# Patient Record
Sex: Female | Born: 1935 | Race: Black or African American | Hispanic: No | Marital: Married | Smoking: Never smoker
Health system: Southern US, Community
[De-identification: ages and names within clinical notes are randomized; demographics above are authoritative.]

## PROBLEM LIST (undated history)

## (undated) DIAGNOSIS — F0390 Unspecified dementia without behavioral disturbance: Secondary | ICD-10-CM

## (undated) DIAGNOSIS — E785 Hyperlipidemia, unspecified: Secondary | ICD-10-CM

## (undated) DIAGNOSIS — F0280 Dementia in other diseases classified elsewhere without behavioral disturbance: Secondary | ICD-10-CM

## (undated) DIAGNOSIS — G4700 Insomnia, unspecified: Secondary | ICD-10-CM

## (undated) DIAGNOSIS — I10 Essential (primary) hypertension: Secondary | ICD-10-CM

## (undated) DIAGNOSIS — G309 Alzheimer's disease, unspecified: Secondary | ICD-10-CM

## (undated) DIAGNOSIS — M1990 Unspecified osteoarthritis, unspecified site: Secondary | ICD-10-CM

## (undated) HISTORY — DX: Unspecified osteoarthritis, unspecified site: M19.90

## (undated) HISTORY — DX: Essential (primary) hypertension: I10

## (undated) HISTORY — DX: Hyperlipidemia, unspecified: E78.5

## (undated) HISTORY — PX: ABDOMINAL HYSTERECTOMY: SHX81

---

## 2001-03-01 ENCOUNTER — Other Ambulatory Visit: Admission: RE | Admit: 2001-03-01 | Discharge: 2001-03-01 | Payer: Self-pay | Admitting: Family Medicine

## 2001-05-08 ENCOUNTER — Ambulatory Visit (HOSPITAL_COMMUNITY): Admission: RE | Admit: 2001-05-08 | Discharge: 2001-05-08 | Payer: Self-pay | Admitting: Family Medicine

## 2001-05-08 ENCOUNTER — Encounter: Payer: Self-pay | Admitting: Family Medicine

## 2001-11-12 ENCOUNTER — Encounter: Admission: RE | Admit: 2001-11-12 | Discharge: 2001-11-12 | Payer: Self-pay | Admitting: Family Medicine

## 2001-11-12 ENCOUNTER — Encounter: Payer: Self-pay | Admitting: Family Medicine

## 2002-05-14 ENCOUNTER — Ambulatory Visit (HOSPITAL_COMMUNITY): Admission: RE | Admit: 2002-05-14 | Discharge: 2002-05-14 | Payer: Self-pay | Admitting: Family Medicine

## 2002-05-14 ENCOUNTER — Encounter: Payer: Self-pay | Admitting: Family Medicine

## 2002-11-11 ENCOUNTER — Ambulatory Visit (HOSPITAL_COMMUNITY): Admission: RE | Admit: 2002-11-11 | Discharge: 2002-11-11 | Payer: Self-pay | Admitting: Family Medicine

## 2002-11-11 ENCOUNTER — Encounter: Payer: Self-pay | Admitting: Family Medicine

## 2003-05-19 ENCOUNTER — Ambulatory Visit (HOSPITAL_COMMUNITY): Admission: RE | Admit: 2003-05-19 | Discharge: 2003-05-19 | Payer: Self-pay | Admitting: Family Medicine

## 2003-06-08 ENCOUNTER — Ambulatory Visit (HOSPITAL_COMMUNITY): Admission: RE | Admit: 2003-06-08 | Discharge: 2003-06-08 | Payer: Self-pay | Admitting: Family Medicine

## 2003-06-25 ENCOUNTER — Ambulatory Visit (HOSPITAL_COMMUNITY): Admission: RE | Admit: 2003-06-25 | Discharge: 2003-06-25 | Payer: Self-pay | Admitting: Family Medicine

## 2004-03-30 ENCOUNTER — Ambulatory Visit: Payer: Self-pay | Admitting: Family Medicine

## 2004-05-26 ENCOUNTER — Ambulatory Visit: Payer: Self-pay | Admitting: Family Medicine

## 2004-05-26 ENCOUNTER — Encounter (INDEPENDENT_AMBULATORY_CARE_PROVIDER_SITE_OTHER): Payer: Self-pay | Admitting: *Deleted

## 2004-05-26 LAB — CONVERTED CEMR LAB: Pap Smear: NORMAL

## 2004-05-29 ENCOUNTER — Emergency Department (HOSPITAL_COMMUNITY): Admission: EM | Admit: 2004-05-29 | Discharge: 2004-05-29 | Payer: Self-pay | Admitting: Emergency Medicine

## 2004-06-20 ENCOUNTER — Ambulatory Visit (HOSPITAL_COMMUNITY): Admission: RE | Admit: 2004-06-20 | Discharge: 2004-06-20 | Payer: Self-pay | Admitting: Family Medicine

## 2004-07-07 ENCOUNTER — Ambulatory Visit: Payer: Self-pay | Admitting: Family Medicine

## 2004-08-02 ENCOUNTER — Ambulatory Visit (HOSPITAL_COMMUNITY): Admission: RE | Admit: 2004-08-02 | Discharge: 2004-08-02 | Payer: Self-pay | Admitting: Family Medicine

## 2004-08-26 ENCOUNTER — Ambulatory Visit: Payer: Self-pay | Admitting: Family Medicine

## 2004-12-27 ENCOUNTER — Ambulatory Visit: Payer: Self-pay | Admitting: Family Medicine

## 2005-03-29 ENCOUNTER — Ambulatory Visit: Payer: Self-pay | Admitting: Family Medicine

## 2005-04-05 ENCOUNTER — Ambulatory Visit: Payer: Self-pay | Admitting: Family Medicine

## 2005-06-23 ENCOUNTER — Ambulatory Visit (HOSPITAL_COMMUNITY): Admission: RE | Admit: 2005-06-23 | Discharge: 2005-06-23 | Payer: Self-pay | Admitting: Family Medicine

## 2005-06-28 ENCOUNTER — Ambulatory Visit: Payer: Self-pay | Admitting: Family Medicine

## 2005-06-28 ENCOUNTER — Encounter (INDEPENDENT_AMBULATORY_CARE_PROVIDER_SITE_OTHER): Payer: Self-pay | Admitting: *Deleted

## 2005-10-27 ENCOUNTER — Ambulatory Visit: Payer: Self-pay | Admitting: Family Medicine

## 2006-02-05 ENCOUNTER — Ambulatory Visit: Payer: Self-pay | Admitting: Family Medicine

## 2006-02-26 ENCOUNTER — Ambulatory Visit: Payer: Self-pay | Admitting: Family Medicine

## 2006-02-27 ENCOUNTER — Ambulatory Visit (HOSPITAL_COMMUNITY): Admission: RE | Admit: 2006-02-27 | Discharge: 2006-02-27 | Payer: Self-pay | Admitting: Family Medicine

## 2006-06-25 ENCOUNTER — Encounter: Payer: Self-pay | Admitting: Family Medicine

## 2006-06-25 ENCOUNTER — Ambulatory Visit (HOSPITAL_COMMUNITY): Admission: RE | Admit: 2006-06-25 | Discharge: 2006-06-25 | Payer: Self-pay | Admitting: Family Medicine

## 2006-06-25 LAB — CONVERTED CEMR LAB
ALT: 14 units/L (ref 0–35)
Alkaline Phosphatase: 55 units/L (ref 39–117)
Bilirubin, Direct: 0.1 mg/dL (ref 0.0–0.3)
Cholesterol: 246 mg/dL — ABNORMAL HIGH (ref 0–200)
Indirect Bilirubin: 0.7 mg/dL (ref 0.0–0.9)
LDL Cholesterol: 162 mg/dL — ABNORMAL HIGH (ref 0–99)
Total Protein: 7.3 g/dL (ref 6.0–8.3)
Triglycerides: 166 mg/dL — ABNORMAL HIGH (ref <150)

## 2006-06-28 ENCOUNTER — Ambulatory Visit: Payer: Self-pay | Admitting: Family Medicine

## 2006-09-21 ENCOUNTER — Encounter: Payer: Self-pay | Admitting: Family Medicine

## 2006-09-21 LAB — CONVERTED CEMR LAB
Alkaline Phosphatase: 53 units/L (ref 39–117)
BUN: 17 mg/dL (ref 6–23)
Bilirubin, Direct: 0.1 mg/dL (ref 0.0–0.3)
Chloride: 102 meq/L (ref 96–112)
Creatinine, Ser: 1.12 mg/dL (ref 0.40–1.20)
Glucose, Bld: 91 mg/dL (ref 70–99)
Indirect Bilirubin: 0.7 mg/dL (ref 0.0–0.9)
LDL Cholesterol: 166 mg/dL — ABNORMAL HIGH (ref 0–99)
Total Protein: 7.3 g/dL (ref 6.0–8.3)
Triglycerides: 162 mg/dL — ABNORMAL HIGH (ref <150)

## 2006-09-26 ENCOUNTER — Encounter: Payer: Self-pay | Admitting: Family Medicine

## 2006-09-26 ENCOUNTER — Ambulatory Visit: Payer: Self-pay | Admitting: Family Medicine

## 2006-09-26 ENCOUNTER — Other Ambulatory Visit: Admission: RE | Admit: 2006-09-26 | Discharge: 2006-09-26 | Payer: Self-pay | Admitting: Family Medicine

## 2006-10-01 ENCOUNTER — Encounter (INDEPENDENT_AMBULATORY_CARE_PROVIDER_SITE_OTHER): Payer: Self-pay | Admitting: *Deleted

## 2006-10-01 LAB — CONVERTED CEMR LAB: Pap Smear: NORMAL

## 2007-01-28 ENCOUNTER — Encounter: Payer: Self-pay | Admitting: Family Medicine

## 2007-01-28 LAB — CONVERTED CEMR LAB
BUN: 13 mg/dL (ref 6–23)
CO2: 27 meq/L (ref 19–32)
Chloride: 103 meq/L (ref 96–112)
Creatinine, Ser: 1.11 mg/dL (ref 0.40–1.20)
Glucose, Bld: 90 mg/dL (ref 70–99)
Potassium: 3.6 meq/L (ref 3.5–5.3)

## 2007-01-29 ENCOUNTER — Encounter: Payer: Self-pay | Admitting: Family Medicine

## 2007-01-29 LAB — CONVERTED CEMR LAB
Alkaline Phosphatase: 48 units/L (ref 39–117)
Bilirubin, Direct: 0.1 mg/dL (ref 0.0–0.3)
Indirect Bilirubin: 0.4 mg/dL (ref 0.0–0.9)
LDL Cholesterol: 131 mg/dL — ABNORMAL HIGH (ref 0–99)
Total Bilirubin: 0.5 mg/dL (ref 0.3–1.2)
Triglycerides: 172 mg/dL — ABNORMAL HIGH (ref <150)
VLDL: 34 mg/dL (ref 0–40)

## 2007-02-01 ENCOUNTER — Ambulatory Visit: Payer: Self-pay | Admitting: Family Medicine

## 2007-05-02 ENCOUNTER — Encounter: Payer: Self-pay | Admitting: Family Medicine

## 2007-05-30 ENCOUNTER — Encounter: Payer: Self-pay | Admitting: Family Medicine

## 2007-05-30 LAB — CONVERTED CEMR LAB
AST: 16 units/L (ref 0–37)
Albumin: 4.7 g/dL (ref 3.5–5.2)
Alkaline Phosphatase: 48 units/L (ref 39–117)
Basophils Absolute: 0 10*3/uL (ref 0.0–0.1)
Basophils Relative: 0 % (ref 0–1)
Bilirubin, Direct: 0.1 mg/dL (ref 0.0–0.3)
Eosinophils Relative: 2 % (ref 0–5)
HCT: 41.2 % (ref 36.0–46.0)
HDL: 51 mg/dL (ref >39)
Hemoglobin: 13.6 g/dL (ref 12.0–15.0)
LDL Cholesterol: 127 mg/dL — ABNORMAL HIGH (ref 0–99)
MCHC: 33 g/dL (ref 30.0–36.0)
MCV: 80.2 fL (ref 78.0–100.0)
Monocytes Absolute: 0.4 10*3/uL (ref 0.1–1.0)
Monocytes Relative: 9 % (ref 3–12)
Neutro Abs: 1.4 10*3/uL — ABNORMAL LOW (ref 1.7–7.7)
RBC: 5.14 M/uL — ABNORMAL HIGH (ref 3.87–5.11)
RDW: 15.2 % (ref 11.5–15.5)
Total Bilirubin: 0.6 mg/dL (ref 0.3–1.2)
Total CHOL/HDL Ratio: 4.2

## 2007-06-04 ENCOUNTER — Ambulatory Visit: Payer: Self-pay | Admitting: Family Medicine

## 2007-06-10 ENCOUNTER — Encounter (HOSPITAL_COMMUNITY): Admission: RE | Admit: 2007-06-10 | Discharge: 2007-07-10 | Payer: Self-pay | Admitting: Family Medicine

## 2007-06-27 ENCOUNTER — Ambulatory Visit (HOSPITAL_COMMUNITY): Admission: RE | Admit: 2007-06-27 | Discharge: 2007-06-27 | Payer: Self-pay | Admitting: Family Medicine

## 2007-07-22 ENCOUNTER — Ambulatory Visit: Payer: Self-pay | Admitting: Family Medicine

## 2007-07-25 ENCOUNTER — Encounter (INDEPENDENT_AMBULATORY_CARE_PROVIDER_SITE_OTHER): Payer: Self-pay | Admitting: *Deleted

## 2007-07-25 DIAGNOSIS — M1710 Unilateral primary osteoarthritis, unspecified knee: Secondary | ICD-10-CM

## 2007-07-25 DIAGNOSIS — M7520 Bicipital tendinitis, unspecified shoulder: Secondary | ICD-10-CM | POA: Insufficient documentation

## 2007-07-25 DIAGNOSIS — E785 Hyperlipidemia, unspecified: Secondary | ICD-10-CM

## 2007-07-30 ENCOUNTER — Encounter (HOSPITAL_COMMUNITY): Admission: RE | Admit: 2007-07-30 | Discharge: 2007-08-29 | Payer: Self-pay | Admitting: Family Medicine

## 2007-08-30 ENCOUNTER — Encounter: Payer: Self-pay | Admitting: Family Medicine

## 2007-08-30 LAB — CONVERTED CEMR LAB
ALT: 11 units/L (ref 0–35)
AST: 15 units/L (ref 0–37)
Albumin: 4.5 g/dL (ref 3.5–5.2)
Calcium: 9.6 mg/dL (ref 8.4–10.5)
Cholesterol: 222 mg/dL — ABNORMAL HIGH (ref 0–200)
HDL: 46 mg/dL (ref >39)
Potassium: 4.5 meq/L (ref 3.5–5.3)
Sodium: 142 meq/L (ref 135–145)
Total CHOL/HDL Ratio: 4.8
Total Protein: 7.1 g/dL (ref 6.0–8.3)
VLDL: 23 mg/dL (ref 0–40)

## 2007-09-02 ENCOUNTER — Ambulatory Visit: Payer: Self-pay | Admitting: Family Medicine

## 2007-09-02 ENCOUNTER — Encounter (HOSPITAL_COMMUNITY): Admission: RE | Admit: 2007-09-02 | Discharge: 2007-10-02 | Payer: Self-pay | Admitting: Family Medicine

## 2007-11-29 ENCOUNTER — Encounter: Payer: Self-pay | Admitting: Family Medicine

## 2007-11-29 LAB — CONVERTED CEMR LAB
ALT: 12 units/L (ref 0–35)
AST: 16 units/L (ref 0–37)
Alkaline Phosphatase: 56 units/L (ref 39–117)
Bilirubin, Direct: 0.1 mg/dL (ref 0.0–0.3)
Cholesterol: 195 mg/dL (ref 0–200)
Indirect Bilirubin: 0.7 mg/dL (ref 0.0–0.9)
Triglycerides: 157 mg/dL — ABNORMAL HIGH (ref <150)

## 2007-12-04 ENCOUNTER — Encounter: Payer: Self-pay | Admitting: Family Medicine

## 2007-12-04 ENCOUNTER — Ambulatory Visit: Payer: Self-pay | Admitting: Family Medicine

## 2008-04-01 ENCOUNTER — Encounter: Payer: Self-pay | Admitting: Family Medicine

## 2008-04-02 LAB — CONVERTED CEMR LAB
ALT: 15 units/L (ref 0–35)
Alkaline Phosphatase: 49 units/L (ref 39–117)
BUN: 16 mg/dL (ref 6–23)
Bilirubin, Direct: 0.1 mg/dL (ref 0.0–0.3)
Calcium: 9.7 mg/dL (ref 8.4–10.5)
Cholesterol: 216 mg/dL — ABNORMAL HIGH (ref 0–200)
Creatinine, Ser: 0.99 mg/dL (ref 0.40–1.20)
Glucose, Bld: 98 mg/dL (ref 70–99)
Indirect Bilirubin: 0.6 mg/dL (ref 0.0–0.9)
Potassium: 4.4 meq/L (ref 3.5–5.3)
VLDL: 31 mg/dL (ref 0–40)

## 2008-04-06 ENCOUNTER — Ambulatory Visit: Payer: Self-pay | Admitting: Family Medicine

## 2008-07-30 ENCOUNTER — Encounter: Payer: Self-pay | Admitting: Family Medicine

## 2008-08-03 LAB — CONVERTED CEMR LAB
Albumin: 4.4 g/dL (ref 3.5–5.2)
Indirect Bilirubin: 0.6 mg/dL (ref 0.0–0.9)
LDL Cholesterol: 91 mg/dL (ref 0–99)
Total Bilirubin: 0.7 mg/dL (ref 0.3–1.2)
Total Protein: 6.8 g/dL (ref 6.0–8.3)
Triglycerides: 145 mg/dL (ref <150)
VLDL: 29 mg/dL (ref 0–40)

## 2008-08-05 ENCOUNTER — Ambulatory Visit: Payer: Self-pay | Admitting: Family Medicine

## 2008-08-05 DIAGNOSIS — R5381 Other malaise: Secondary | ICD-10-CM

## 2008-08-05 DIAGNOSIS — E663 Overweight: Secondary | ICD-10-CM | POA: Insufficient documentation

## 2008-08-05 DIAGNOSIS — R5383 Other fatigue: Secondary | ICD-10-CM

## 2008-08-06 DIAGNOSIS — I10 Essential (primary) hypertension: Secondary | ICD-10-CM | POA: Insufficient documentation

## 2008-08-11 ENCOUNTER — Ambulatory Visit (HOSPITAL_COMMUNITY): Admission: RE | Admit: 2008-08-11 | Discharge: 2008-08-11 | Payer: Self-pay | Admitting: Family Medicine

## 2008-09-09 ENCOUNTER — Encounter: Payer: Self-pay | Admitting: Family Medicine

## 2008-12-28 LAB — CONVERTED CEMR LAB
ALT: 13 units/L (ref 0–35)
Bilirubin, Direct: 0.2 mg/dL (ref 0.0–0.3)
CO2: 26 meq/L (ref 19–32)
Chloride: 105 meq/L (ref 96–112)
Glucose, Bld: 104 mg/dL — ABNORMAL HIGH (ref 70–99)
Hemoglobin: 12.5 g/dL (ref 12.0–15.0)
Lymphocytes Relative: 51 % — ABNORMAL HIGH (ref 12–46)
Monocytes Absolute: 0.6 10*3/uL (ref 0.1–1.0)
Monocytes Relative: 15 % — ABNORMAL HIGH (ref 3–12)
Neutro Abs: 1.3 10*3/uL — ABNORMAL LOW (ref 1.7–7.7)
Potassium: 3.9 meq/L (ref 3.5–5.3)
RBC: 4.83 M/uL (ref 3.87–5.11)
Sodium: 144 meq/L (ref 135–145)
TSH: 3.049 microintl units/mL (ref 0.350–4.500)
Total Bilirubin: 0.7 mg/dL (ref 0.3–1.2)
Total CHOL/HDL Ratio: 3.2
VLDL: 17 mg/dL (ref 0–40)

## 2009-01-01 ENCOUNTER — Other Ambulatory Visit: Admission: RE | Admit: 2009-01-01 | Discharge: 2009-01-01 | Payer: Self-pay | Admitting: Family Medicine

## 2009-01-01 ENCOUNTER — Encounter: Payer: Self-pay | Admitting: Family Medicine

## 2009-01-01 ENCOUNTER — Ambulatory Visit: Payer: Self-pay | Admitting: Family Medicine

## 2009-01-01 LAB — CONVERTED CEMR LAB: OCCULT 1: NEGATIVE

## 2009-01-11 ENCOUNTER — Encounter: Payer: Self-pay | Admitting: Family Medicine

## 2009-01-14 ENCOUNTER — Telehealth: Payer: Self-pay | Admitting: Family Medicine

## 2009-02-10 ENCOUNTER — Ambulatory Visit (HOSPITAL_COMMUNITY): Admission: RE | Admit: 2009-02-10 | Discharge: 2009-02-10 | Payer: Self-pay | Admitting: Family Medicine

## 2009-02-22 ENCOUNTER — Encounter: Payer: Self-pay | Admitting: Family Medicine

## 2009-05-14 LAB — CONVERTED CEMR LAB
ALT: 11 units/L (ref 0–35)
Albumin: 4.4 g/dL (ref 3.5–5.2)
Alkaline Phosphatase: 58 units/L (ref 39–117)
Chloride: 104 meq/L (ref 96–112)
Creatinine, Ser: 0.93 mg/dL (ref 0.40–1.20)
HDL: 56 mg/dL (ref >39)
LDL Cholesterol: 109 mg/dL — ABNORMAL HIGH (ref 0–99)
Potassium: 4.3 meq/L (ref 3.5–5.3)
Total Protein: 6.9 g/dL (ref 6.0–8.3)
Triglycerides: 78 mg/dL (ref <150)

## 2009-05-19 ENCOUNTER — Ambulatory Visit: Payer: Self-pay | Admitting: Family Medicine

## 2009-05-21 ENCOUNTER — Telehealth: Payer: Self-pay | Admitting: Family Medicine

## 2009-08-27 ENCOUNTER — Ambulatory Visit (HOSPITAL_COMMUNITY): Admission: RE | Admit: 2009-08-27 | Discharge: 2009-08-27 | Payer: Self-pay | Admitting: Family Medicine

## 2009-09-15 ENCOUNTER — Encounter: Payer: Self-pay | Admitting: Family Medicine

## 2009-09-16 ENCOUNTER — Encounter: Payer: Self-pay | Admitting: Family Medicine

## 2009-09-16 LAB — CONVERTED CEMR LAB
ALT: 14 units/L (ref 0–35)
AST: 18 units/L (ref 0–37)
Albumin: 4.6 g/dL (ref 3.5–5.2)
Alkaline Phosphatase: 57 units/L (ref 39–117)
Calcium: 9.9 mg/dL (ref 8.4–10.5)
Chloride: 103 meq/L (ref 96–112)
Cholesterol: 179 mg/dL (ref 0–200)
Creatinine, Ser: 0.95 mg/dL (ref 0.40–1.20)
HDL: 51 mg/dL (ref >39)
LDL Cholesterol: 101 mg/dL — ABNORMAL HIGH (ref 0–99)
Total Protein: 7.3 g/dL (ref 6.0–8.3)
Triglycerides: 136 mg/dL (ref <150)

## 2009-09-22 ENCOUNTER — Ambulatory Visit: Payer: Self-pay | Admitting: Family Medicine

## 2009-09-22 DIAGNOSIS — R7301 Impaired fasting glucose: Secondary | ICD-10-CM | POA: Insufficient documentation

## 2009-10-14 ENCOUNTER — Encounter: Payer: Self-pay | Admitting: Internal Medicine

## 2009-11-10 ENCOUNTER — Ambulatory Visit: Payer: Self-pay | Admitting: Internal Medicine

## 2009-11-10 ENCOUNTER — Ambulatory Visit (HOSPITAL_COMMUNITY): Admission: RE | Admit: 2009-11-10 | Discharge: 2009-11-10 | Payer: Self-pay | Admitting: Internal Medicine

## 2009-11-14 ENCOUNTER — Encounter: Payer: Self-pay | Admitting: Internal Medicine

## 2010-02-04 LAB — CONVERTED CEMR LAB
ALT: 12 units/L (ref 0–35)
Albumin: 4.5 g/dL (ref 3.5–5.2)
Alkaline Phosphatase: 58 units/L (ref 39–117)
Basophils Absolute: 0 10*3/uL (ref 0.0–0.1)
Basophils Relative: 0 % (ref 0–1)
Chloride: 102 meq/L (ref 96–112)
Cholesterol: 169 mg/dL (ref 0–200)
Creatinine, Ser: 1 mg/dL (ref 0.40–1.20)
Eosinophils Absolute: 0.1 10*3/uL (ref 0.0–0.7)
HDL: 49 mg/dL (ref >39)
Indirect Bilirubin: 0.7 mg/dL (ref 0.0–0.9)
LDL Cholesterol: 94 mg/dL (ref 0–99)
MCHC: 31.7 g/dL (ref 30.0–36.0)
MCV: 82.4 fL (ref 78.0–100.0)
Neutro Abs: 1.6 10*3/uL — ABNORMAL LOW (ref 1.7–7.7)
Neutrophils Relative %: 33 % — ABNORMAL LOW (ref 43–77)
Potassium: 4.1 meq/L (ref 3.5–5.3)
RDW: 15.6 % — ABNORMAL HIGH (ref 11.5–15.5)
Total Protein: 6.7 g/dL (ref 6.0–8.3)
Triglycerides: 130 mg/dL (ref <150)
VLDL: 26 mg/dL (ref 0–40)

## 2010-02-09 ENCOUNTER — Ambulatory Visit: Payer: Self-pay | Admitting: Family Medicine

## 2010-05-22 ENCOUNTER — Encounter: Payer: Self-pay | Admitting: Family Medicine

## 2010-05-31 NOTE — Letter (Signed)
Summary: labs  labs   Imported By: Curtis Sites 09/24/2009 15:53:08  _____________________________________________________________________  External Attachment:    Type:   Image     Comment:   External Document

## 2010-05-31 NOTE — Letter (Signed)
Summary: consults  consults   Imported By: Curtis Sites 09/24/2009 15:53:26  _____________________________________________________________________  External Attachment:    Type:   Image     Comment:   External Document

## 2010-05-31 NOTE — Letter (Signed)
Summary: misc.  misc.   Imported By: Curtis Sites 09/24/2009 15:53:49  _____________________________________________________________________  External Attachment:    Type:   Image     Comment:   External Document

## 2010-05-31 NOTE — Assessment & Plan Note (Signed)
Summary: office visit   Vital Signs:  Patient profile:   77 year old female Menstrual status:  hysterectomy Height:      66.5 inches Weight:      174.75 pounds BMI:     27.88 O2 Sat:      99 % Pulse rate:   84 / minute Pulse rhythm:   regular Resp:     16 per minute BP sitting:   118 / 70  (left arm) Cuff size:   regular  Vitals Entered By: Everitt Amber LPN (Sep 22, 2009 8:29 AM)  Nutrition Counseling: Patient's BMI is greater than 25 and therefore counseled on weight management options. CC: Follow up chronic problems   CC:  Follow up chronic problems.  History of Present Illness: Reports  that she has been  doing well. Denies recent fever or chills. Denies sinus pressure, nasal congestion , ear pain or sore throat. Denies chest congestion, or cough productive of sputum. Denies chest pain, palpitations, PND, orthopnea or leg swelling. Denies abdominal pain, nausea, vomitting, diarrhea or constipation. Denies change in bowel movements or bloody stool. Denies dysuria , frequency, incontinence or hesitancy. Denies  joint pain, swelling, or reduced mobility. Denies headaches, vertigo, seizures. Denies depression, anxiety or insomnia. Denies  rash, lesions, or itch.     Current Medications (verified): 1)  Hydrochlorothiazide 25 Mg  Tabs (Hydrochlorothiazide) .... One Tab By Mouth Once Daily 2)  Metoprolol Succinate 25 Mg Xr24h-Tab (Metoprolol Succinate) .... Take 1 Tablet By Mouth Once A Day 3)  Potassium Chloride 10 Meq/145ml Soln (Potassium Chloride) .... Take 1 and 1/2 Tsp By Mouth Once Daily 4)  Ecotrin Low Strength 81 Mg  Tbec (Aspirin) .... One Tab By Mouth Once A Day 5)  Diphenhydramine Hcl 50 Mg Caps (Diphenhydramine Hcl) .... One Cap By Mouth Qhs 6)  Clinical Nutrients 45-Plus Women .... One Tab By Mouth Tid 7)  Osteosheath 4 Enhanced Bone Support .... One Tab By Mouth Tid 8)  Omega-3/ Omega-6 Fish Oil .... One Cap By Mouth Qd 9)  Coq10 100 Mg Caps (Coenzyme Q10)  .... One Cap By Mouth Tid 10)  Cinnamon 1000mg  .... One Cap By Mouth Qd 11)  Caduet 10-40 Mg Tabs (Amlodipine-Atorvastatin) .... One Tab By Mouth Qhs  Allergies (verified): 1)  ! Pcn  Review of Systems      See HPI General:  Complains of fatigue; denies chills and fever; mild fatigue , and intermittent sleep problems, benadryl helps. Eyes:  Denies blurring, eye pain, and red eye. GI:  Complains of constipation; needs to use prune juicwe daily for bowel movements. MS:  Complains of joint pain and stiffness; lg toes start popping but no pain. Endo:  Denies excessive thirst and excessive urination. Heme:  Denies abnormal bruising and bleeding. Allergy:  Denies hives or rash and itching eyes.  Physical Exam  General:  Well-developed,well-nourished,in no acute distress; alert,appropriate and cooperative throughout examination HEENT: No facial asymmetry,  EOMI, No sinus tenderness, TM's Clear, oropharynx  pink and moist.   Chest: Clear to auscultation bilaterally.  CVS: S1, S2, No murmurs, No S3.   Abd: Soft, Nontender.  MS: decreased  ROM spine, hips, shoulders and knees.  Ext: No edema.   CNS: CN 2-12 intact, power tone and sensation normal throughout.   Skin: Intact, no visible lesions or rashes.  Psych: Good eye contact, normal affect.  Memory intact, not anxious or depressed appearing.    Impression & Recommendations:  Problem # 1:  IMPAIRED FASTING GLUCOSE (  JJO-841.66) Assessment Comment Only  Orders: T- Hemoglobin A1C ([id number])low carb , low sugar diet discussed and encouraged.  Problem # 2:  HYPERTENSION (ICD-401.9) Assessment: Unchanged  Her updated medication list for this problem includes:    Hydrochlorothiazide 25 Mg Tabs (Hydrochlorothiazide) ..... One tab by mouth once daily    Metoprolol Succinate 25 Mg Xr24h-tab (Metoprolol succinate) .Marland Kitchen... Take 1 tablet by mouth once a day    Caduet 10-40 Mg Tabs (Amlodipine-atorvastatin) ..... One tab by mouth  qhs  Orders: T-Basic Metabolic Panel [id number])  BP today: 118/70 Prior BP: 112/70 (05/19/2009)  Labs Reviewed: K+: 4.4 (09/15/2009) Creat: : 0.95 (09/15/2009)   Chol: 179 (09/15/2009)   HDL: 51 (09/15/2009)   LDL: 101 (09/15/2009)   TG: 136 (09/15/2009)  Problem # 3:  HYPERLIPIDEMIA (ICD-272.4) Assessment: Comment Only  Her updated medication list for this problem includes:    Caduet 10-40 Mg Tabs (Amlodipine-atorvastatin) ..... One tab by mouth qhs  Orders: T-Hepatic Function [id number]) T-Lipid Profile [id number])  Labs Reviewed: SGOT: 18 (09/15/2009)   SGPT: 14 (09/15/2009)   HDL:51 (09/15/2009), 56 (05/14/2009)  LDL:101 (09/15/2009), 109 (05/14/2009)  Chol:179 (09/15/2009), 181 (05/14/2009)  Trig:136 (09/15/2009), 78 (05/14/2009)  Problem # 4:  OSTEOARTHRITIS, KNEE (ICD-715.96) Assessment: Improved  Her updated medication list for this problem includes:    Ecotrin Low Strength 81 Mg Tbec (Aspirin) ..... One tab by mouth once a day  Complete Medication List: 1)  Hydrochlorothiazide 25 Mg Tabs (Hydrochlorothiazide) .... One tab by mouth once daily 2)  Metoprolol Succinate 25 Mg Xr24h-tab (Metoprolol succinate) .... Take 1 tablet by mouth once a day 3)  Potassium Chloride 10 Meq/182ml Soln (Potassium chloride) .... Take 1 and 1/2 tsp by mouth once daily 4)  Ecotrin Low Strength 81 Mg Tbec (Aspirin) .... One tab by mouth once a day 5)  Diphenhydramine Hcl 50 Mg Caps (Diphenhydramine hcl) .... One cap by mouth qhs 6)  Clinical Nutrients 45-plus Women  .... One tab by mouth tid 7)  Osteosheath 4 Enhanced Bone Support  .... One tab by mouth tid 8)  Omega-3/ Omega-6 Fish Oil  .... One cap by mouth qd 9)  Coq10 100 Mg Caps (Coenzyme q10) .... One cap by mouth tid 10)  Cinnamon 1000mg   .... One cap by mouth qd 11)  Caduet 10-40 Mg Tabs (Amlodipine-atorvastatin) .... One tab by mouth qhs  Other Orders: T-CBC w/Diff ([id number]) Gastroenterology Referral (GI) Future  Orders: Radiology Referral (Radiology) ... 09/23/2009  Patient Instructions: 1)  F/U in 4.5 months 2)  You are doing very well. pls continue  eat alot of fruit and veg., and commit to regular physical activity 20 to 30 mins daily. 3)  No med changes. 4)  You need a colonscopy and bone density scan mn Oct we will sched 5)  BMP prior to visit, ICD-9: 6)  Hepatic Panel prior to visit, ICD-9: 7)  Lipid Panel prior to visit, ICD-9:    8)  CBC w/ Diff prior to visit, ICD-9:   fasting in 4.5 months 9)  HbgA1C prior to visit, ICD-9: Prescriptions: POTASSIUM CHLORIDE 10 MEQ/100ML SOLN (POTASSIUM CHLORIDE) Take 1 and 1/2 tsp by mouth once daily  #480 x 3   Entered by:   Everitt Amber LPN   Authorized by:   Syliva Overman MD   Signed by:   Everitt Amber LPN on 60/73/7106   Method used:   Electronically to        [facility] (retail)       [phone]       [place], [place]  [address]       Ph: [phone]       Fax: [phone]   RxID:   [id number] HYDROCHLOROTHIAZIDE 25 MG  TABS (HYDROCHLOROTHIAZIDE) one tab by mouth once daily  #120 Each x 3   Entered by:   Everitt Amber LPN   Authorized by:   Syliva Overman MD   Signed by:   Everitt Amber LPN on 84/13/2440   Method used:   Electronically to        [facility] (retail)       [address] [place], [place]  [address]       Ph: [phone]       Fax: [phone]   RxID:   [id number]

## 2010-05-31 NOTE — Letter (Signed)
Summary: xray  xray   Imported By: Curtis Sites 09/24/2009 15:54:31  _____________________________________________________________________  External Attachment:    Type:   Image     Comment:   External Document

## 2010-05-31 NOTE — Assessment & Plan Note (Signed)
Summary: F UP   Vital Signs:  Patient profile:   77 year old female Menstrual status:  hysterectomy Height:      66.5 inches Weight:      175.75 pounds BMI:     28.04 O2 Sat:      99 % on Room air Pulse rate:   73 / minute Pulse rhythm:   regular Resp:     16 per minute BP sitting:   122 / 60  (left arm)  Vitals Entered By: Mauricia Area, CMA  Nutrition Counseling: Patient's BMI is greater than 25 and therefore counseled on weight management options.  O2 Flow:  Room air CC: Follow up   CC:  Follow up.  History of Present Illness: Reports  that she has been doing well. Denies recent fever or chills. Denies sinus pressure, nasal congestion , ear pain or sore throat. Denies chest congestion, or cough productive of sputum. Denies chest pain, palpitations, PND, orthopnea or leg swelling. Denies abdominal pain, nausea, vomitting, diarrhea or constipation. Denies change in bowel movements or bloody stool. Denies dysuria , frequency, incontinence or hesitancy. she does have occasioonal  joint pain,  and  reduced mobility. Denies headaches, vertigo, seizures. Denies depression, anxiety or insomnia. Denies  rash, lesions, or itch.     Allergies (verified): 1)  ! Pcn  Review of Systems      See HPI Eyes:  Denies blurring, discharge, eye pain, and red eye. Endo:  Denies cold intolerance, excessive hunger, excessive thirst, and heat intolerance. Heme:  Denies abnormal bruising and bleeding. Allergy:  Complains of seasonal allergies; denies hives or rash and itching eyes.  Physical Exam  General:  Well-developed,well-nourished,in no acute distress; alert,appropriate and cooperative throughout examination HEENT: No facial asymmetry,  EOMI, No sinus tenderness, TM's Clear, oropharynx  pink and moist.   Chest: Clear to auscultation bilaterally.  CVS: S1, S2, No murmurs, No S3.   Abd: Soft, Nontender.  MS: decreased  ROM spine, hips, shoulders and knees.  Ext: No edema.     CNS: CN 2-12 intact, power tone and sensation normal throughout.   Skin: Intact, no visible lesions or rashes.  Psych: Good eye contact, normal affect.  Memory intact, not anxious or depressed appearing.    Impression & Recommendations:  Problem # 1:  IMPAIRED FASTING GLUCOSE (ICD-790.21) Assessment Comment Only  Orders: T- Hemoglobin A1C ([id number]) Pt advised to reduce carbohydrate intake, espescially sweets, and to start regular physical activity, at least 30 minutes 5 days weekly, to enable weight loss, and reduce the risk of becoming diabetic   Problem # 2:  HYPERTENSION (ICD-401.9) Assessment: Unchanged  Her updated medication list for this problem includes:    Hydrochlorothiazide 25 Mg Tabs (Hydrochlorothiazide) ..... One tab by mouth once daily    Metoprolol Succinate 25 Mg Xr24h-tab (Metoprolol succinate) .Marland Kitchen... Take 1 tablet by mouth once a day    Caduet 10-40 Mg Tabs (Amlodipine-atorvastatin) ..... One tab by mouth at bedtime  Orders: T-Basic Metabolic Panel [id number])  BP today: 122/60 Prior BP: 118/70 (09/22/2009)  Labs Reviewed: K+: 4.1 (02/04/2010) Creat: : 1.00 (02/04/2010)   Chol: 169 (02/04/2010)   HDL: 49 (02/04/2010)   LDL: 94 (02/04/2010)   TG: 130 (02/04/2010)  Problem # 3:  OVERWEIGHT (ICD-278.02) Assessment: Unchanged  Ht: 66.5 (02/09/2010)   Wt: 175.75 (02/09/2010)   BMI: 28.04 (02/09/2010) therapeutic lifestyle change discussed and encouraged  Problem # 4:  HYPERLIPIDEMIA (ICD-272.4) Assessment: Improved  Her updated medication list for this problem includes:  Caduet 10-40 Mg Tabs (Amlodipine-atorvastatin) ..... One tab by mouth at bedtime  Orders: T-Hepatic Function [id number]) T-Lipid Profile [id number]) Low fat diet discussed and encouraged, and literature also given  Labs Reviewed: SGOT: 16 (02/04/2010)   SGPT: 12 (02/04/2010)   HDL:49 (02/04/2010), 51 (09/15/2009)  LDL:94 (02/04/2010), 101 (41/96/2229)  Chol:169  (02/04/2010), 179 (09/15/2009)  Trig:130 (02/04/2010), 136 (09/15/2009)  Complete Medication List: 1)  Hydrochlorothiazide 25 Mg Tabs (Hydrochlorothiazide) .... One tab by mouth once daily 2)  Metoprolol Succinate 25 Mg Xr24h-tab (Metoprolol succinate) .... Take 1 tablet by mouth once a day 3)  Potassium Chloride 10 Meq/171ml Soln (Potassium chloride) .... Take 1 and 1/2 tsp by mouth once daily 4)  Ecotrin Low Strength 81 Mg Tbec (Aspirin) .... One tab by mouth once a day 5)  Clinical Nutrients 45-plus Women  .... One tab by mouth once daily 6)  Osteosheath 4 Enhanced Bone Support  .... One tab by mouth once daily 7)  Omega-3/ Omega-6 Fish Oil  .... One cap by mouth once daily 8)  Cinnamon 1000mg   .... One cap by mouth once daily 9)  Caduet 10-40 Mg Tabs (Amlodipine-atorvastatin) .... One tab by mouth at bedtime  Patient Instructions: 1)  F/U in 4.5 months 2)  BMP prior to visit, ICD-9: 3)  Hepatic Panel prior to visit, ICD-9: 4)  Lipid Panel prior to visit, ICD-9: fasting in 4.5  months 5)  HbgA1C prior to visit, ICD-9: 6)  Flu vac today. 7)  No med changes. 8)  Pt advised to reduce carbohydrate intake, espescially sweets, and to start regular physical activity, at least 30 minutes 5 days weekly, to enable weight loss, and reduce the risk of becoming diabetic  9)  HBA1C  blood sugar avg is 6.0 normal is less than 5.7  10)  This will reduce the risk of becoming a diabetic  or delay it's development 11)  The medication list was reviewed and reconciled..All changed/newly prescribed medications were explained. A complete medication list was provided to the patient/caregiver.

## 2010-05-31 NOTE — Letter (Signed)
Summary: history and physical  history and physical   Imported By: Curtis Sites 09/24/2009 15:46:30  _____________________________________________________________________  External Attachment:    Type:   Image     Comment:   External Document

## 2010-05-31 NOTE — Assessment & Plan Note (Signed)
Summary: office visit   Vital Signs:  Patient profile:   77 year old female Menstrual status:  hysterectomy Height:      66.5 inches Weight:      176 pounds BMI:     28.08 O2 Sat:      99 % Pulse rate:   73 / minute Pulse rhythm:   regular Resp:     16 per minute BP sitting:   112 / 70 Cuff size:   regular  Vitals Entered By: Everitt Amber (May 19, 2009 11:02 AM)  Nutrition Counseling: Patient's BMI is greater than 25 and therefore counseled on weight management options.  History of Present Illness: Reports  that she has been doing well. Denies recent fever or chills. Denies sinus pressure, nasal congestion , ear pain or sore throat. Denies chest congestion, or cough productive of sputum. Denies chest pain, palpitations, PND, orthopnea or leg swelling. Denies abdominal pain, nausea, vomitting, diarrhea or constipation. Denies change in bowel movements or bloody stool. Denies dysuria , frequency, incontinence or hesitancy. Reports joint pain, stiffness with reduced mobility. denies any falls. Denies headaches, vertigo, seizures. Denies depression, anxiety or insomnia. Denies  rash, lesions, or itch.     Allergies: 1)  ! Pcn  Review of Systems      See HPI Eyes:  Denies blurring and discharge. Neuro:  Denies headaches, seizures, and sensation of room spinning. Endo:  Denies cold intolerance, excessive hunger, excessive thirst, excessive urination, heat intolerance, polyuria, and weight change. Heme:  Denies abnormal bruising and bleeding. Allergy:  Complains of seasonal allergies; denies hives or rash.  Physical Exam  General:  Well-developed,well-nourished,in no acute distress; alert,appropriate and cooperative throughout examination HEENT: No facial asymmetry,  EOMI, No sinus tenderness, TM's Clear, oropharynx  pink and moist.   Chest: Clear to auscultation bilaterally.  CVS: S1, S2, No murmurs, No S3.   Abd: Soft, Nontender.  MS: decreased  ROM spine, hips,  shoulders and knees.  Ext: No edema.   CNS: CN 2-12 intact, power tone and sensation normal throughout.   Skin: Intact, no visible lesions or rashes.  Psych: Good eye contact, normal affect.  Memory intact, not anxious or depressed appearing.    Impression & Recommendations:  Problem # 1:  HYPERTENSION (ICD-401.9) Assessment Unchanged  Her updated medication list for this problem includes:    Hydrochlorothiazide 25 Mg Tabs (Hydrochlorothiazide) ..... One tab by mouth once daily    Metoprolol Succinate 25 Mg Xr24h-tab (Metoprolol succinate) .Marland Kitchen... Take 1 tablet by mouth once a day    Caduet 10-40 Mg Tabs (Amlodipine-atorvastatin) ..... One tab by mouth qhs  Orders: T-Basic Metabolic Panel [phone])  BP today: 112/70 Prior BP: 122/72 (01/01/2009)  Labs Reviewed: K+: 4.3 (05/14/2009) Creat: : 0.93 (05/14/2009)   Chol: 181 (05/14/2009)   HDL: 56 (05/14/2009)   LDL: 109 (05/14/2009)   TG: 78 (05/14/2009)  Problem # 2:  HYPERLIPIDEMIA (ICD-272.4) Assessment: Deteriorated  Her updated medication list for this problem includes:    Caduet 10-40 Mg Tabs (Amlodipine-atorvastatin) ..... One tab by mouth qhs  Orders: T-Hepatic Function [id number]) T-Lipid Profile [id number])  Labs Reviewed: SGOT: 20 (05/14/2009)   SGPT: 11 (05/14/2009)   HDL:56 (05/14/2009), 47 (12/28/2008)  LDL:109 (05/14/2009), 88 (57/84/6962)  Chol:181 (05/14/2009), 152 (12/28/2008)  Trig:78 (05/14/2009), 84 (12/28/2008)  Problem # 3:  OSTEOARTHRITIS, KNEE (ICD-715.96) Assessment: Unchanged  Her updated medication list for this problem includes:    Ecotrin Low Strength 81 Mg Tbec (Aspirin) ..... One tab by mouth once a  day  Problem # 4:  OVERWEIGHT (ICD-278.02) Assessment: Unchanged  Ht: 66.5 (05/19/2009)   Wt: 176 (05/19/2009)   BMI: 28.08 (05/19/2009)  Complete Medication List: 1)  Hydrochlorothiazide 25 Mg Tabs (Hydrochlorothiazide) .... One tab by mouth once daily 2)  Metoprolol Succinate 25 Mg  Xr24h-tab (Metoprolol succinate) .... Take 1 tablet by mouth once a day 3)  Potassium Chloride 10 Meq/137ml Soln (Potassium chloride) .... Take 1 and 1/2 tsp by mouth once daily 4)  Ecotrin Low Strength 81 Mg Tbec (Aspirin) .... One tab by mouth once a day 5)  Diphenhydramine Hcl 50 Mg Caps (Diphenhydramine hcl) .... One cap by mouth qhs 6)  Clinical Nutrients 45-plus Women  .... One tab by mouth tid 7)  Osteosheath 4 Enhanced Bone Support  .... One tab by mouth tid 8)  Omega-3/ Omega-6 Fish Oil  .... One cap by mouth qd 9)  Coq10 100 Mg Caps (Coenzyme q10) .... One cap by mouth tid 10)  Cinnamon 1000mg   .... One cap by mouth qd 11)  Caduet 10-40 Mg Tabs (Amlodipine-atorvastatin) .... One tab by mouth qhs 12)  Clobetasol Propionate 0.05 % Oint (Clobetasol propionate) .... Apply twice daily to affected area for 10 days, then as needed  Patient Instructions: 1)  Please schedule a follow-up appointment in 4.5 months. 2)  Hepatic Panel prior to visit, ICD-9: 3)  Lipid Panel prior to visit, ICD-9: fasting in 4. 5 months 4)  Chem 7 5)  Pls cut  back on sweets and fried foods. 6)  No med changes at this time

## 2010-05-31 NOTE — Progress Notes (Signed)
  Phone Note From Pharmacy   Caller: [facility] Summary of Call: requesting clobetasol 0.05% ointment last dispensed in sept 2010 45gm apply to the affected areas twice daily for 10 days, then as needed Initial call taken by: Worthy Keeler LPN,  May 21, 2009 3:41 PM  Follow-up for Phone Call        refill x 1 pls Follow-up by: Syliva Overman MD,  May 21, 2009 4:52 PM  Additional Follow-up for Phone Call Additional follow up Details #1::        Prescription resent Additional Follow-up by: Worthy Keeler LPN,  May 24, 2009 2:02 PM    New/Updated Medications: CLOBETASOL PROPIONATE 0.05 % OINT (CLOBETASOL PROPIONATE) apply to affected areas two times a day times 10 days, then as needed Prescriptions: CLOBETASOL PROPIONATE 0.05 % OINT (CLOBETASOL PROPIONATE) apply to affected areas two times a day times 10 days, then as needed  #45gm x 0   Entered by:   Worthy Keeler LPN   Authorized by:   Syliva Overman MD   Signed by:   Worthy Keeler LPN on 09/81/1914   Method used:   Electronically to        [facility] (retail)       [address] [place], [place]  [address]       Ph: [phone]       Fax: [phone]   RxID:   [id number]

## 2010-05-31 NOTE — Letter (Signed)
Summary: Patient Notice, Colon Biopsy Results  [facility]  [address]   [place], [place] [address]   Phone: [phone]  Fax: [phone]       November 14, 2009   ETHERINE MACKOWIAK [address] [place], [place]  [address] 1931/11/13    Dear Ms. Bally,  I am pleased to inform you that the biopsies taken during your recent colonoscopy did not show any evidence of cancer upon pathologic examination.  Additional information/recommendations  You should have a repeat colonoscopy examination  in 7 years.  Please call us if you are having persistent problems or have questions about your condition that have not been fully answered at this time.  Sincerely,    R. Roetta Sessions MD, FACP [facility] Ph: [phone]    Fax: [phone]   Appended Document: Patient Notice, Colon Biopsy Results letter mailed to pt  Appended Document: Patient Notice, Colon Biopsy Results REMINDER IN COMPUTER

## 2010-05-31 NOTE — Letter (Signed)
Summary: demographic  demographic   Imported By: Curtis Sites 09/24/2009 15:08:33  _____________________________________________________________________  External Attachment:    Type:   Image     Comment:   External Document

## 2010-05-31 NOTE — Letter (Signed)
Summary: Internal Other /triage/instructions  Internal Other /triage/instructions   Imported By: Cloria Spring LPN 34/74/2595 63:87:56  _____________________________________________________________________  External Attachment:    Type:   Image     Comment:   External Document

## 2010-05-31 NOTE — Letter (Signed)
Summary: progress notes  progress notes   Imported By: Curtis Sites 09/24/2009 15:55:34  _____________________________________________________________________  External Attachment:    Type:   Image     Comment:   External Document

## 2010-05-31 NOTE — Letter (Signed)
Summary: phone notes  phone notes   Imported By: Curtis Sites 09/24/2009 15:54:08  _____________________________________________________________________  External Attachment:    Type:   Image     Comment:   External Document

## 2010-06-28 ENCOUNTER — Encounter: Payer: Self-pay | Admitting: Family Medicine

## 2010-06-28 ENCOUNTER — Telehealth: Payer: Self-pay | Admitting: Family Medicine

## 2010-07-07 NOTE — Miscellaneous (Signed)
  Clinical Lists Changes  Medications: Added new medication of POTASSIUM CHLORIDE 20 MEQ/15ML (10%) SOLN (POTASSIUM CHLORIDE) one and one half teaspoons once daily, by mouth - Signed Removed medication of POTASSIUM CHLORIDE 10 MEQ/100ML SOLN (POTASSIUM CHLORIDE) Take 1 and 1/2 tsp by mouth once daily Rx of POTASSIUM CHLORIDE 20 MEQ/15ML (10%) SOLN (POTASSIUM CHLORIDE) one and one half teaspoons once daily, by mouth;  #277ml x 4;  Signed;  Entered by: Syliva Overman MD;  Authorized by: Syliva Overman MD;  Method used: Historical    Prescriptions: POTASSIUM CHLORIDE 20 MEQ/15ML (10%) SOLN (POTASSIUM CHLORIDE) one and one half teaspoons once daily, by mouth  #252ml x 4   Entered and Authorized by:   Syliva Overman MD   Signed by:   Syliva Overman MD on 06/28/2010   Method used:   Historical   RxID:   [id number]

## 2010-07-07 NOTE — Progress Notes (Signed)
  Phone Note Call from Patient   Summary of Call: Potassium Chloride 10% liquid  ( )  Dilute  1 1/2 tsp in a full glass of water.  This was directly from her bottle. No MEQ or anything, just 10% Initial call taken by: Everitt Amber LPN,  June 28, 2010 4:44 PM    New/Updated Medications: POTASSIUM CHLORIDE 20 MEQ/15ML (10%) SOLN (POTASSIUM CHLORIDE)  POTASSIUM CHLORIDE 20 MEQ/15ML (10%) LIQD (POTASSIUM CHLORIDE) one and a half teaspoon in 8 ounces of water once daily Prescriptions: POTASSIUM CHLORIDE 20 MEQ/15ML (10%) LIQD (POTASSIUM CHLORIDE) one and a half teaspoon in 8 ounces of water once daily  #600cc x 3   Entered and Authorized by:   Syliva Overman MD   Signed by:   Syliva Overman MD on 07/01/2010   Method used:   Historical   RxID:   [id number]

## 2010-07-08 ENCOUNTER — Other Ambulatory Visit: Payer: Self-pay | Admitting: Family Medicine

## 2010-07-08 LAB — HEPATIC FUNCTION PANEL
ALT: 11 U/L (ref 0–35)
AST: 19 U/L (ref 0–37)
Alkaline Phosphatase: 55 U/L (ref 39–117)
Bilirubin, Direct: 0.2 mg/dL (ref 0.0–0.3)
Indirect Bilirubin: 0.8 mg/dL (ref 0.0–0.9)
Total Bilirubin: 1 mg/dL (ref 0.3–1.2)

## 2010-07-08 LAB — CONVERTED CEMR LAB
Albumin: 4.8 g/dL (ref 3.5–5.2)
BUN: 13 mg/dL (ref 6–23)
Chloride: 102 meq/L (ref 96–112)
HDL: 51 mg/dL (ref >39)
Hgb A1c MFr Bld: 6 % — ABNORMAL HIGH (ref <5.7)
Indirect Bilirubin: 0.8 mg/dL (ref 0.0–0.9)
LDL Cholesterol: 88 mg/dL (ref 0–99)
Potassium: 3.7 meq/L (ref 3.5–5.3)
Sodium: 141 meq/L (ref 135–145)
Total CHOL/HDL Ratio: 3.1
Total Protein: 6.9 g/dL (ref 6.0–8.3)
Triglycerides: 102 mg/dL (ref <150)
VLDL: 20 mg/dL (ref 0–40)

## 2010-07-08 LAB — LIPID PANEL
Cholesterol: 159 mg/dL (ref 0–200)
Total CHOL/HDL Ratio: 3.1 Ratio

## 2010-07-08 LAB — BASIC METABOLIC PANEL
CO2: 29 mEq/L (ref 19–32)
Calcium: 9.4 mg/dL (ref 8.4–10.5)
Creat: 1.03 mg/dL (ref 0.40–1.20)
Glucose, Bld: 90 mg/dL (ref 70–99)
Sodium: 141 mEq/L (ref 135–145)

## 2010-07-08 LAB — HEMOGLOBIN A1C: Hgb A1c MFr Bld: 6 % — ABNORMAL HIGH (ref <5.7)

## 2010-07-14 ENCOUNTER — Encounter: Payer: Self-pay | Admitting: Family Medicine

## 2010-07-14 ENCOUNTER — Ambulatory Visit (INDEPENDENT_AMBULATORY_CARE_PROVIDER_SITE_OTHER): Payer: Medicare Other | Admitting: Family Medicine

## 2010-07-14 DIAGNOSIS — I10 Essential (primary) hypertension: Secondary | ICD-10-CM

## 2010-07-14 DIAGNOSIS — E785 Hyperlipidemia, unspecified: Secondary | ICD-10-CM

## 2010-07-14 DIAGNOSIS — M1710 Unilateral primary osteoarthritis, unspecified knee: Secondary | ICD-10-CM

## 2010-07-18 DIAGNOSIS — G3184 Mild cognitive impairment, so stated: Secondary | ICD-10-CM | POA: Insufficient documentation

## 2010-07-28 NOTE — Assessment & Plan Note (Signed)
Summary: f up   Vital Signs:  Patient profile:   77 year old female Menstrual status:  hysterectomy Height:      66.5 inches Weight:      178.25 pounds BMI:     28.44 O2 Sat:      98 % on Room air Pulse rate:   80 / minute Pulse rhythm:   regular Resp:     16 per minute BP sitting:   130 / 70  (left arm)  Vitals Entered By: Adella Hare LPN (July 14, 2010 8:11 AM)  Nutrition Counseling: Patient's BMI is greater than 25 and therefore counseled on weight management options.  O2 Flow:  Room air CC: follow-up visit Is Patient Diabetic? No   CC:  follow-up visit.  History of Present Illness: Reports  that she is doing fairly well. Denies recent fever or chills. Denies sinus pressure,  ear pain or sore throat. Denies chest congestion, or cough productive of sputum. Denies chest pain, palpitations, PND, orthopnea or leg swelling. Denies abdominal pain, nausea, vomitting, diarrhea or constipation. Denies change in bowel movements or bloody stool. Denies dysuria , frequency, incontinence or hesitancy. Denies  joint pain, swelling, or reduced mobility. Denies headaches, vertigo, seizures. Denies depression, anxiety or insomnia. Denies  rash, lesions, or itch.     Current Medications (verified): 1)  Hydrochlorothiazide 25 Mg  Tabs (Hydrochlorothiazide) .... One Tab By Mouth Once Daily 2)  Metoprolol Succinate 25 Mg Xr24h-Tab (Metoprolol Succinate) .... Take 1 Tablet By Mouth Once A Day 3)  Ecotrin Low Strength 81 Mg  Tbec (Aspirin) .... One Tab By Mouth Once A Day 4)  Clinical Nutrients 45-Plus Women .... One Tab By Mouth Once Daily 5)  Osteosheath 4 Enhanced Bone Support .... One Tab By Mouth Once Daily 6)  Omega-3/ Omega-6 Fish Oil .... One Cap By Mouth Once Daily 7)  Cinnamon 1000mg  .... One Cap By Mouth Once Daily 8)  Caduet 10-40 Mg Tabs (Amlodipine-Atorvastatin) .... One Tab By Mouth At Bedtime 9)  Potassium Chloride 20 Meq/77ml (10%) Liqd (Potassium Chloride) .... One  and A Half Teaspoon in 8 Ounces of Water Once Daily 10)  Multivitamins  Tabs (Multiple Vitamin) .... One Tab By Mouth Once Daily 11)  Co Q-10 100 Mg Caps (Coenzyme Q10) .... One Cap By Mouth Two Times A Day  Allergies (verified): 1)  ! Pcn  Review of Systems      See HPI General:  Complains of sleep disorder; benadryl assists with sleep ,  often takes a nap in the day. Eyes:  Complains of red eye; watery eyes. Neuro:  Complains of memory loss; pt unable to draw clock face, count backwards or or do 3 object recall. Psych:  Denies anxiety and depression. Endo:  Denies cold intolerance, excessive hunger, excessive thirst, and excessive urination. Heme:  Denies abnormal bruising and bleeding. Allergy:  Denies itching eyes and sneezing; nasal congestion and excessive sneezing since the Springtime is approaching which is expected.  Physical Exam  General:  Well-developed,well-nourished,in no acute distress; alert,appropriate and cooperative throughout examination HEENT: No facial asymmetry,  EOMI, No sinus tenderness, TM's Clear, oropharynx  pink and moist.   Chest: Clear to auscultation bilaterally.  CVS: S1, S2, No murmurs, No S3.   Abd: Soft, Nontender.  MS: Adequate ROM spine, hips, shoulders and knees.  Ext: No edema.   CNS: CN 2-12 intact, power tone and sensation normal throughout.   Skin: Intact, no visible lesions or rashes.  Psych: Good eye contact,  normal affect.  Memory intact, not anxious or depressed appearing.    Impression & Recommendations:  Problem # 1:  HYPERTENSION (ICD-401.9) Assessment Unchanged  Her updated medication list for this problem includes:    Hydrochlorothiazide 25 Mg Tabs (Hydrochlorothiazide) ..... One tab by mouth once daily    Metoprolol Succinate 25 Mg Xr24h-tab (Metoprolol succinate) .Marland Kitchen... Take 1 tablet by mouth once a day    Caduet 10-40 Mg Tabs (Amlodipine-atorvastatin) ..... One tab by mouth at bedtime  BP today: 130/70 Prior BP: 122/60  (02/09/2010)  Labs Reviewed: K+: 3.7 (07/08/2010) Creat: : 1.03 (07/08/2010)   Chol: 159 (07/08/2010)   HDL: 51 (07/08/2010)   LDL: 88 (07/08/2010)   TG: 102 (07/08/2010)  Orders: T-Basic Metabolic Panel [phone]) Medicare Electronic Prescription [phone])  Problem # 2:  HYPERLIPIDEMIA (ICD-272.4) Assessment: Improved  Her updated medication list for this problem includes:    Caduet 10-40 Mg Tabs (Amlodipine-atorvastatin) ..... One tab by mouth at bedtime  Labs Reviewed: SGOT: 19 (07/08/2010)   SGPT: 11 (07/08/2010)   HDL:51 (07/08/2010), 49 (02/04/2010)  LDL:88 (07/08/2010), 94 (02/04/2010)  Chol:159 (07/08/2010), 169 (02/04/2010)  Trig:102 (07/08/2010), 130 (02/04/2010)  Orders: T-Lipid Profile ([id number]) T-Hepatic Function ([id number])  Problem # 3:  OSTEOARTHRITIS, KNEE (ICD-715.96) Assessment: Improved  Her updated medication list for this problem includes:    Ecotrin Low Strength 81 Mg Tbec (Aspirin) ..... One tab by mouth once a day  Problem # 4:  MILD COGNITIVE IMPAIRMENT SO STATED (ICD-331.83) Assessment: Comment Only will re-evaluate at next visit, discussed possible use of meds  Complete Medication List: 1)  Hydrochlorothiazide 25 Mg Tabs (Hydrochlorothiazide) .... One tab by mouth once daily 2)  Metoprolol Succinate 25 Mg Xr24h-tab (Metoprolol succinate) .... Take 1 tablet by mouth once a day 3)  Ecotrin Low Strength 81 Mg Tbec (Aspirin) .... One tab by mouth once a day 4)  Clinical Nutrients 45-plus Women  .... One tab by mouth once daily 5)  Osteosheath 4 Enhanced Bone Support  .... One tab by mouth once daily 6)  Omega-3/ Omega-6 Fish Oil  .... One cap by mouth once daily 7)  Cinnamon 1000mg   .... One cap by mouth once daily 8)  Caduet 10-40 Mg Tabs (Amlodipine-atorvastatin) .... One tab by mouth at bedtime 9)  Potassium Chloride 20 Meq/103ml (10%) Liqd (Potassium chloride) .... One and a half teaspoon in 8 ounces of water once daily 10)  Multivitamins  Tabs (Multiple vitamin) .... One tab by mouth once daily 11)  Co Q-10 100 Mg Caps (Coenzyme q10) .... One cap by mouth two times a day  Other Orders: T-CBC w/Diff ([id number]) T- Hemoglobin A1C ([id number]) T-TSH ([id number])  Patient Instructions: 1)  Please schedule a cPE sept 4 or after 2)  It is important that you exercise regularly at least 30 minutes 6 times a week. If you develop chest pain, have severe difficulty breathing, or feel very tired , stop exercising immediately and seek medical attention. 3)  Your labs are great. No med changes. 4)  : 5)  BMP prior to visit, ICD-9: 6)  Hepatic Panel prior to visit, ICD-9: 7)  Lipid Panel prior to visit, ICD-9:  fasting  in September 8)  TSH prior to visit, ICD-9: 9)  CBC w/ Diff prior to visit, ICD-9: 10)  HbgA1C prior to visit, ICD-9: 11)  PLS cut back on sweets and cookies Prescriptions: POTASSIUM CHLORIDE 20 MEQ/15ML (10%) LIQD (POTASSIUM CHLORIDE) one and a half teaspoon in 8 ounces of water once daily  #  480 Each x 3   Entered by:   Adella Hare LPN   Authorized by:   Syliva Overman MD   Signed by:   Adella Hare LPN on 13/11/6576   Method used:   Electronically to        [facility]* (retail)       [address] [place], [place]  [address]       Ph: [phone]       Fax: [phone]   RxID:   [id number] CADUET 10-40 MG TABS (AMLODIPINE-ATORVASTATIN) one tab by mouth at bedtime  #30 x 3   Entered by:   Adella Hare LPN   Authorized by:   Syliva Overman MD   Signed by:   Adella Hare LPN on 64/33/2951   Method used:   Electronically to        [facility]* (retail)       [address] [place], [place]  [address]       Ph: [phone]       Fax: [phone]   RxID:   [id number]    Orders Added: 1)  Est. Patient Level IV [51761] 2)  T-Basic Metabolic Panel [id number] 3)  T-Lipid Profile [[id number]] 4)   T-Hepatic Function [[id number]] 5)  T-CBC w/Diff [[id number]] 6)  T- Hemoglobin A1C [83036-23375] 7)  T-TSH [[id number]] 8)  Medicare Electronic Prescription [H3716]

## 2010-08-15 ENCOUNTER — Other Ambulatory Visit: Payer: Self-pay | Admitting: Family Medicine

## 2010-08-15 DIAGNOSIS — Z139 Encounter for screening, unspecified: Secondary | ICD-10-CM

## 2010-09-01 ENCOUNTER — Ambulatory Visit (HOSPITAL_COMMUNITY)
Admission: RE | Admit: 2010-09-01 | Discharge: 2010-09-01 | Disposition: A | Discharge status: Home / Self Care | Payer: Medicare Other | Source: Ambulatory Visit | Attending: Family Medicine | Admitting: Family Medicine

## 2010-09-01 DIAGNOSIS — Z139 Encounter for screening, unspecified: Secondary | ICD-10-CM

## 2010-09-01 DIAGNOSIS — Z1231 Encounter for screening mammogram for malignant neoplasm of breast: Secondary | ICD-10-CM | POA: Insufficient documentation

## 2010-10-21 ENCOUNTER — Other Ambulatory Visit: Payer: Self-pay | Admitting: Family Medicine

## 2010-11-29 ENCOUNTER — Other Ambulatory Visit: Payer: Self-pay | Admitting: Family Medicine

## 2011-01-11 ENCOUNTER — Other Ambulatory Visit: Payer: Self-pay | Admitting: Family Medicine

## 2011-01-11 LAB — HEMOGLOBIN A1C
Hgb A1c MFr Bld: 6.1 % — ABNORMAL HIGH (ref <5.7)
Mean Plasma Glucose: 128 mg/dL — ABNORMAL HIGH (ref <117)

## 2011-01-11 LAB — HEPATIC FUNCTION PANEL
Albumin: 4.6 g/dL (ref 3.5–5.2)
Total Bilirubin: 0.8 mg/dL (ref 0.3–1.2)
Total Protein: 7.4 g/dL (ref 6.0–8.3)

## 2011-01-11 LAB — CBC WITH DIFFERENTIAL/PLATELET
Basophils Absolute: 0 10*3/uL (ref 0.0–0.1)
Lymphocytes Relative: 52 % — ABNORMAL HIGH (ref 12–46)
Lymphs Abs: 2.4 10*3/uL (ref 0.7–4.0)
Neutro Abs: 1.7 10*3/uL (ref 1.7–7.7)
Neutrophils Relative %: 38 % — ABNORMAL LOW (ref 43–77)
Platelets: 244 10*3/uL (ref 150–400)
RBC: 5.09 MIL/uL (ref 3.87–5.11)
RDW: 15.3 % (ref 11.5–15.5)
WBC: 4.7 10*3/uL (ref 4.0–10.5)

## 2011-01-11 LAB — BASIC METABOLIC PANEL
BUN: 12 mg/dL (ref 6–23)
CO2: 25 mEq/L (ref 19–32)
Chloride: 102 mEq/L (ref 96–112)
Creat: 0.94 mg/dL (ref 0.50–1.10)
Potassium: 4.2 mEq/L (ref 3.5–5.3)

## 2011-01-11 LAB — LIPID PANEL
HDL: 50 mg/dL (ref >39)
LDL Cholesterol: 106 mg/dL — ABNORMAL HIGH (ref 0–99)
Triglycerides: 150 mg/dL — ABNORMAL HIGH (ref <150)
VLDL: 30 mg/dL (ref 0–40)

## 2011-01-11 LAB — TSH: TSH: 3.412 u[IU]/mL (ref 0.350–4.500)

## 2011-01-16 ENCOUNTER — Encounter: Payer: Self-pay | Admitting: Family Medicine

## 2011-01-17 ENCOUNTER — Ambulatory Visit (INDEPENDENT_AMBULATORY_CARE_PROVIDER_SITE_OTHER): Payer: Medicare Other | Admitting: Family Medicine

## 2011-01-17 ENCOUNTER — Encounter: Payer: Self-pay | Admitting: Family Medicine

## 2011-01-17 ENCOUNTER — Telehealth: Payer: Self-pay | Admitting: Family Medicine

## 2011-01-17 VITALS — BP 120/70 | HR 83 | Resp 16 | Ht 66.5 in | Wt 180.4 lb

## 2011-01-17 DIAGNOSIS — R7301 Impaired fasting glucose: Secondary | ICD-10-CM

## 2011-01-17 DIAGNOSIS — M1710 Unilateral primary osteoarthritis, unspecified knee: Secondary | ICD-10-CM

## 2011-01-17 DIAGNOSIS — E785 Hyperlipidemia, unspecified: Secondary | ICD-10-CM

## 2011-01-17 DIAGNOSIS — IMO0002 Reserved for concepts with insufficient information to code with codable children: Secondary | ICD-10-CM

## 2011-01-17 DIAGNOSIS — Z23 Encounter for immunization: Secondary | ICD-10-CM

## 2011-01-17 DIAGNOSIS — I10 Essential (primary) hypertension: Secondary | ICD-10-CM

## 2011-01-17 MED ORDER — INFLUENZA VAC TYPES A & B PF IM SUSP: 0.5000 mL | Freq: Once | INTRAMUSCULAR | Status: DC

## 2011-01-17 MED ORDER — METOPROLOL SUCCINATE ER 25 MG PO TB24: 25.0000 mg | ORAL_TABLET | Freq: Every day | ORAL | Status: DC

## 2011-01-17 MED ORDER — HYDROCHLOROTHIAZIDE 25 MG PO TABS: 25.0000 mg | ORAL_TABLET | Freq: Every day | ORAL | Status: DC

## 2011-01-17 NOTE — Telephone Encounter (Signed)
Called patient left message

## 2011-01-17 NOTE — Telephone Encounter (Signed)
Patient aware.

## 2011-01-17 NOTE — Telephone Encounter (Signed)
pls let pt know blood sugar has increased, and her cholesterol has also increased. Other labs are fine Give values, lipids and HBa1C. She needs to focus on changing her eating to increased veg and fruit, also encourage her to go to grp session at the hospital, she can collect labs if she wants/we mail them.  WILL NEED REPT HBA!C with next labs also in 4 months

## 2011-01-22 ENCOUNTER — Encounter: Payer: Self-pay | Admitting: Family Medicine

## 2011-01-22 NOTE — Assessment & Plan Note (Signed)
Controlled, no change in medication  

## 2011-01-22 NOTE — Assessment & Plan Note (Signed)
  Unchanged, counseled pt to control weight and maintain regular physical activity

## 2011-01-22 NOTE — Progress Notes (Signed)
  Subjective:    Patient ID: Angela Cordova, female    DOB: [birth year], 77 y.o.   MRN: [id number]  HPI The PT is here for follow up and re-evaluation of chronic medical conditions, medication management and review of any available recent lab and radiology data.  Preventive health is updated, specifically  Cancer screening and Immunization.   Questions or concerns regarding consultations or procedures which the PT has had in the interim are  addressed. The PT denies any adverse reactions to current medications since the last visit.  There are no new concerns.  C/o arthritic pain ,espescially in right leg, knee and hip, denies instability or falls    Review of Systems Denies recent fever or chills. Denies sinus pressure, nasal congestion, ear pain or sore throat. Denies chest congestion, productive cough or wheezing. Denies chest pains, palpitations and leg swelling Denies abdominal pain, nausea, vomiting,diarrhea or constipation.   Denies dysuria, frequency, hesitancy or incontinence.  Denies headaches, seizures, numbness, or tingling. Denies depression, anxiety or insomnia. Denies skin break down or rash.        Objective:   Physical Exam Patient alert and oriented and in no cardiopulmonary distress.  HEENT: No facial asymmetry, EOMI, no sinus tenderness,  oropharynx pink and moist.  Neck supple no adenopathy.  Chest: Clear to auscultation bilaterally.  CVS: S1, S2 no murmurs, no S3.  ABD: Soft non tender. Bowel sounds normal.  Ext: No edema  MS: Adequate though decreased  ROM spine, shoulders, hips and knees.  Skin: Intact, no ulcerations or rash noted.  Psych: Good eye contact, normal affect. Memory mildly impaired not anxious or depressed appearing.  CNS: CN 2-12 intact, power, tone and sensation normal throughout.        Assessment & Plan:

## 2011-01-22 NOTE — Assessment & Plan Note (Signed)
Deteriorated, the importance of reduced fat diet stressed

## 2011-01-22 NOTE — Patient Instructions (Addendum)
CPE in 4 months.  You will be contacted once your labs are available and these will be reviewed with you. No med changes at this time. It is important that you exercise regularly at least 30 minutes 5 times a week. If you develop chest pain, have severe difficulty breathing, or feel very tired, stop exercising immediately and seek medical attention  A healthy diet is rich in fruit, vegetables and whole grains. Poultry fish, nuts and beans are a healthy choice for protein rather then red meat. A low sodium diet and drinking 64 ounces of water daily is generally recommended. Oils and sweet should be limited. Carbohydrates especially for those who are diabetic or overweight, should be limited to 34-45 gram per meal. It is important to eat on a regular schedule, at least 3 times daily. Snacks should be primarily fruits, vegetables or nuts.   LABWORK  NEEDS TO BE DONE BETWEEN 3 TO 7 DAYS BEFORE YOUR NEXT SCEDULED  VISIT.  THIS WILL IMPROVE THE QUALITY OF YOUR CARE. Fasting labs in 4 months

## 2011-03-29 ENCOUNTER — Other Ambulatory Visit: Payer: Self-pay | Admitting: Family Medicine

## 2011-05-15 LAB — HEPATIC FUNCTION PANEL
ALT: 14 U/L (ref 0–35)
AST: 19 U/L (ref 0–37)
Alkaline Phosphatase: 55 U/L (ref 39–117)
Bilirubin, Direct: 0.2 mg/dL (ref 0.0–0.3)
Indirect Bilirubin: 0.6 mg/dL (ref 0.0–0.9)
Total Bilirubin: 0.8 mg/dL (ref 0.3–1.2)

## 2011-05-15 LAB — BASIC METABOLIC PANEL
BUN: 12 mg/dL (ref 6–23)
CO2: 30 mEq/L (ref 19–32)
Calcium: 9.7 mg/dL (ref 8.4–10.5)
Creat: 1 mg/dL (ref 0.50–1.10)
Glucose, Bld: 108 mg/dL — ABNORMAL HIGH (ref 70–99)
Sodium: 142 mEq/L (ref 135–145)

## 2011-05-15 LAB — LIPID PANEL: Cholesterol: 186 mg/dL (ref 0–200)

## 2011-05-17 ENCOUNTER — Encounter: Payer: Self-pay | Admitting: Family Medicine

## 2011-05-18 ENCOUNTER — Encounter: Payer: Self-pay | Admitting: Family Medicine

## 2011-05-18 ENCOUNTER — Ambulatory Visit (INDEPENDENT_AMBULATORY_CARE_PROVIDER_SITE_OTHER): Payer: Medicare Other | Admitting: Family Medicine

## 2011-05-18 VITALS — BP 120/80 | HR 76 | Resp 16 | Ht 66.5 in | Wt 185.4 lb

## 2011-05-18 DIAGNOSIS — E663 Overweight: Secondary | ICD-10-CM

## 2011-05-18 DIAGNOSIS — E785 Hyperlipidemia, unspecified: Secondary | ICD-10-CM

## 2011-05-18 DIAGNOSIS — L259 Unspecified contact dermatitis, unspecified cause: Secondary | ICD-10-CM

## 2011-05-18 DIAGNOSIS — R7301 Impaired fasting glucose: Secondary | ICD-10-CM

## 2011-05-18 DIAGNOSIS — I10 Essential (primary) hypertension: Secondary | ICD-10-CM

## 2011-05-18 DIAGNOSIS — L309 Dermatitis, unspecified: Secondary | ICD-10-CM | POA: Insufficient documentation

## 2011-05-18 MED ORDER — BETAMETHASONE VALERATE 0.1 % EX OINT: TOPICAL_OINTMENT | Freq: Two times a day (BID) | CUTANEOUS | Status: AC

## 2011-05-18 NOTE — Patient Instructions (Signed)
CPE in mid June.  Please cut down on sweets and fatty foods.   Medication is sent in for rash on your hand.     Fasting lipid, chem 7 HBa1C  In June  No med changes at this time  Mammogram is due in may, you can call and schedule it in April

## 2011-05-18 NOTE — Assessment & Plan Note (Signed)
Controlled, no change in medication  

## 2011-05-18 NOTE — Assessment & Plan Note (Addendum)
Deteriorated, low carb diet discussed and encouraged 

## 2011-05-21 NOTE — Progress Notes (Signed)
  Subjective:    Patient ID: Angela Cordova, female    DOB: [birth year], 78 y.o.   MRN: [id number]  HPI The PT is here for follow up and re-evaluation of chronic medical conditions, medication management and review of any available recent lab and radiology data.  Preventive health is updated, specifically  Cancer screening and Immunization.   Questions or concerns regarding consultations or procedures which the PT has had in the interim are  addressed. The PT denies any adverse reactions to current medications since the last visit.  There are no new concerns.  C/o increased rash on palms of hands, has had this in the past and requests cream be sent back in for this. Has seen derm in the past also, no interest at this time     Review of Systems See HPI Denies recent fever or chills. Denies sinus pressure, nasal congestion, ear pain or sore throat. Denies chest congestion, productive cough or wheezing. Denies chest pains, palpitations and leg swelling Denies abdominal pain, nausea, vomiting,diarrhea or constipation.   Denies dysuria, frequency, hesitancy or incontinence. C/o joint pain,mild  swelling and no significant  limitation in mobility. Denies headaches, seizures, numbness, or tingling. Denies depression, anxiety or insomnia.         Objective:   Physical Exam Patient alert and oriented and in no cardiopulmonary distress.  HEENT: No facial asymmetry, EOMI, no sinus tenderness,  oropharynx pink and moist.  Neck supple no adenopathy.  Chest: Clear to auscultation bilaterally.  CVS: S1, S2 no murmurs, no S3.  ABD: Soft non tender. Bowel sounds normal.  Ext: No edema  MS: Adequate ROM spine, shoulders, hips and knees.  Skin: Intact, erythematous scaling rash in palms  Psych: Good eye contact, normal affect. Memory fair  not anxious or depressed appearing.  CNS: CN 2-12 intact, power, tone and sensation normal throughout.        Assessment & Plan:

## 2011-05-21 NOTE — Assessment & Plan Note (Signed)
Deteriorated. Patient re-educated about  the importance of commitment to a  minimum of 150 minutes of exercise per week. The importance of healthy food choices with portion control discussed. Encouraged to start a food diary, count calories and to consider  joining a support group. Sample diet sheets offered. Goals set by the patient for the next several months.    

## 2011-05-21 NOTE — Assessment & Plan Note (Signed)
Increased flare of symptom, likely related to cold weather, med prescribed, and pt encouraged to keep skin moisturized

## 2011-05-21 NOTE — Assessment & Plan Note (Signed)
Deteriorated, LDL has increased, counseled re need for dietary modification

## 2011-05-22 ENCOUNTER — Other Ambulatory Visit: Payer: Self-pay

## 2011-05-22 MED ORDER — AMLODIPINE-ATORVASTATIN 10-40 MG PO TABS: 1.0000 | ORAL_TABLET | Freq: Every day | ORAL | Status: DC

## 2011-05-22 MED ORDER — HYDROCHLOROTHIAZIDE 25 MG PO TABS: 25.0000 mg | ORAL_TABLET | Freq: Every day | ORAL | Status: DC

## 2011-07-07 ENCOUNTER — Other Ambulatory Visit: Payer: Self-pay | Admitting: Family Medicine

## 2011-07-11 ENCOUNTER — Telehealth: Payer: Self-pay | Admitting: Family Medicine

## 2011-07-11 NOTE — Telephone Encounter (Signed)
The historical one was the one she has been taking so I refilled to [facility]

## 2011-07-11 NOTE — Telephone Encounter (Signed)
We don't have potassium pills on her list- only historical liquid potassium

## 2011-08-15 ENCOUNTER — Other Ambulatory Visit: Payer: Self-pay | Admitting: Family Medicine

## 2011-08-21 ENCOUNTER — Other Ambulatory Visit: Payer: Self-pay | Admitting: Family Medicine

## 2011-08-21 DIAGNOSIS — Z139 Encounter for screening, unspecified: Secondary | ICD-10-CM

## 2011-09-08 ENCOUNTER — Ambulatory Visit (HOSPITAL_COMMUNITY)
Admission: RE | Admit: 2011-09-08 | Discharge: 2011-09-08 | Disposition: A | Discharge status: Home / Self Care | Payer: Medicare Other | Source: Ambulatory Visit | Attending: Family Medicine | Admitting: Family Medicine

## 2011-09-08 DIAGNOSIS — Z139 Encounter for screening, unspecified: Secondary | ICD-10-CM

## 2011-09-08 DIAGNOSIS — Z1231 Encounter for screening mammogram for malignant neoplasm of breast: Secondary | ICD-10-CM | POA: Insufficient documentation

## 2011-10-12 LAB — BASIC METABOLIC PANEL
Calcium: 9.7 mg/dL (ref 8.4–10.5)
Creat: 1.16 mg/dL — ABNORMAL HIGH (ref 0.50–1.10)
Sodium: 142 mEq/L (ref 135–145)

## 2011-10-12 LAB — LIPID PANEL
HDL: 52 mg/dL (ref >39)
LDL Cholesterol: 104 mg/dL — ABNORMAL HIGH (ref 0–99)
Total CHOL/HDL Ratio: 3.6 Ratio
Triglycerides: 161 mg/dL — ABNORMAL HIGH (ref <150)

## 2011-10-12 LAB — HEMOGLOBIN A1C: Hgb A1c MFr Bld: 5.9 % — ABNORMAL HIGH (ref <5.7)

## 2011-10-17 ENCOUNTER — Encounter: Payer: Self-pay | Admitting: Family Medicine

## 2011-10-17 ENCOUNTER — Ambulatory Visit (INDEPENDENT_AMBULATORY_CARE_PROVIDER_SITE_OTHER): Payer: Medicare Other | Admitting: Family Medicine

## 2011-10-17 VITALS — BP 126/74 | HR 82 | Resp 18 | Ht 66.5 in | Wt 186.0 lb

## 2011-10-17 DIAGNOSIS — I10 Essential (primary) hypertension: Secondary | ICD-10-CM

## 2011-10-17 DIAGNOSIS — G3184 Mild cognitive impairment, so stated: Secondary | ICD-10-CM

## 2011-10-17 DIAGNOSIS — E785 Hyperlipidemia, unspecified: Secondary | ICD-10-CM

## 2011-10-17 DIAGNOSIS — G309 Alzheimer's disease, unspecified: Secondary | ICD-10-CM | POA: Insufficient documentation

## 2011-10-17 DIAGNOSIS — F0280 Dementia in other diseases classified elsewhere without behavioral disturbance: Secondary | ICD-10-CM | POA: Insufficient documentation

## 2011-10-17 DIAGNOSIS — Z1211 Encounter for screening for malignant neoplasm of colon: Secondary | ICD-10-CM

## 2011-10-17 MED ORDER — AMLODIPINE-ATORVASTATIN 10-40 MG PO TABS: 1.0000 | ORAL_TABLET | Freq: Every day | ORAL | Status: DC

## 2011-10-17 MED ORDER — DONEPEZIL HCL 5 MG PO TABS: 5.0000 mg | ORAL_TABLET | Freq: Every day | ORAL | Status: DC

## 2011-10-17 MED ORDER — HYDROCHLOROTHIAZIDE 25 MG PO TABS: ORAL_TABLET | ORAL | Status: DC

## 2011-10-17 NOTE — Progress Notes (Signed)
  Subjective:    Patient ID: Angela Cordova, female    DOB: 05-01-1932, 78 y.o.   MRN: [id number]  HPI The PT is here for follow up and re-evaluation of chronic medical conditions, medication management and review of any available recent lab and radiology data.  Preventive health is updated, specifically  Cancer screening and Immunization.   Questions or concerns regarding consultations or procedures which the PT has had in the interim are  addressed. The PT denies any adverse reactions to current medications since the last visit.  There are no new concerns.  There are no specific complaints       Review of Systems See HPI Denies recent fever or chills. Denies sinus pressure, nasal congestion, ear pain or sore throat. Denies chest congestion, productive cough or wheezing. Denies chest pains, palpitations and leg swelling Denies abdominal pain, nausea, vomiting,diarrhea or constipation.   Denies dysuria, frequency, hesitancy or incontinence. Denies uncontrolled or disabling  joint pain, swelling and limitation in mobility. Denies headaches, seizures, numbness, or tingling. Denies depression, anxiety or insomnia. Denies skin break down or rash.        Objective:   Physical Exam Patient alert and oriented and in no cardiopulmonary distress.Moderate cognitive impairment with deterioration since checked 1 year ago  HEENT: No facial asymmetry, EOMI, no sinus tenderness,  oropharynx pink and moist.  Neck decreased though adequate ROM,  no adenopathy.  Chest: Clear to auscultation bilaterally.  CVS: S1, S2 no murmurs, no S3.  ABD: Soft non tender. Bowel sounds normal.  Ext: No edema  MS: Adequate though reduced  ROM spine, shoulders, hips and knees.  Skin: Intact, no ulcerations or rash noted.  Psych: Good eye contact, normal affect. Memory impaired, mildly  anxious not  depressed appearing.  CNS: CN 2-12 intact, power, tone and sensation normal throughout.          Assessment & Plan:

## 2011-10-17 NOTE — Patient Instructions (Addendum)
F/u in 10 weeks.  New medication to be started for help with memory.  Please read and do puzzles, stay involved in community activities.  Rectal exam today in office is normal   Labs and blood pressure are great

## 2011-11-13 NOTE — Assessment & Plan Note (Signed)
Mini mental status shows marked memory loss, pt to start aricept and return in 7 weeks for follow up

## 2011-11-13 NOTE — Assessment & Plan Note (Signed)
Controlled, no change in medication  

## 2011-11-13 NOTE — Assessment & Plan Note (Signed)
Hyperlipidemia:Low fat diet discussed and encouraged.  No med change at this time, pt advised to reduce fried and fatty foods as her LDL is elevated

## 2011-11-14 NOTE — Addendum Note (Signed)
Addended by: Abner Greenspan on: 11/14/2011 11:38 AM   Modules accepted: Orders

## 2011-12-02 ENCOUNTER — Other Ambulatory Visit: Payer: Self-pay | Admitting: Family Medicine

## 2011-12-18 ENCOUNTER — Other Ambulatory Visit: Payer: Self-pay | Admitting: Family Medicine

## 2011-12-26 ENCOUNTER — Ambulatory Visit: Payer: Medicare Other | Admitting: Family Medicine

## 2012-01-04 ENCOUNTER — Encounter: Payer: Self-pay | Admitting: Family Medicine

## 2012-01-04 ENCOUNTER — Ambulatory Visit (INDEPENDENT_AMBULATORY_CARE_PROVIDER_SITE_OTHER): Payer: Medicare Other | Admitting: Family Medicine

## 2012-01-04 VITALS — BP 132/60 | HR 88 | Resp 18 | Ht 66.5 in | Wt 180.1 lb

## 2012-01-04 DIAGNOSIS — I10 Essential (primary) hypertension: Secondary | ICD-10-CM

## 2012-01-04 DIAGNOSIS — F0390 Unspecified dementia without behavioral disturbance: Secondary | ICD-10-CM

## 2012-01-04 DIAGNOSIS — E785 Hyperlipidemia, unspecified: Secondary | ICD-10-CM

## 2012-01-04 NOTE — Patient Instructions (Addendum)
F/u in 6 weeks.  Please start the aricept one at bedtime as discussed.  You need to have a brain scan, please call and let me know if you decide tohave this done

## 2012-01-06 NOTE — Progress Notes (Signed)
  Subjective:    Patient ID: Angela Cordova, female    DOB: August 13, 1931, 78 y.o.   MRN: [id number]  HPI The PT is here for follow up and re-evaluation of chronic medical conditions, medication management and review of any available recent lab and radiology data.Primary reason for f/u is re evaluation of response yo aricept, she unfortunately never took th medication. I asked her  husband to come into the office at visit , so he could be made aware of the extent of her illness, also the fact that I recommend the need for a brain scan. H is reluctant on tat but is encouraging her to take the medication Preventive health is updated, specifically  Cancer screening and Immunization.    There are no new concerns.  There are no specific complaints       Review of Systems See HPI Denies recent fever or chills. Denies sinus pressure, nasal congestion, ear pain or sore throat. Denies chest congestion, productive cough or wheezing. Denies chest pains, palpitations and leg swelling Denies abdominal pain, nausea, vomiting,diarrhea or constipation.   Denies dysuria, frequency, hesitancy or incontinence. Denies joint pain, swelling and limitation in mobility. Denies headaches, seizures, numbness, or tingling. Denies depression, anxiety or insomnia. Denies skin break down or rash.        Objective:   Physical Exam Patient alert and in no cardiopulmonary distress.  HEENT: No facial asymmetry, EOMI, no sinus tenderness,  oropharynx pink and moist.  Neck decreased ROM no adenopathy.  Chest: Clear to auscultation bilaterally.  CVS: S1, S2 no murmurs, no S3.  ABD: Soft non tender. Bowel sounds normal.  Ext: No edema  MS: Adequate ROM spine, shoulders, hips and knees.  Skin: Intact, no ulcerations or rash noted.  Psych: Good eye contact, blunted  affect. Memory loss, unable to accurately tell her age, states she is 78, unaware of year, mildly anxious not depressed appearing.  CNS: CN 2-12  intact, power, tone and sensation normal throughout.        Assessment & Plan:

## 2012-01-07 NOTE — Assessment & Plan Note (Signed)
Controlled, no change in medication DASH diet and commitment to daily physical activity for a minimum of 30 minutes discussed and encouraged, as a part of hypertension management. The importance of attaining a healthy weight is also discussed.  

## 2012-01-07 NOTE — Assessment & Plan Note (Signed)
Elevated LDL and triglyceride, pt encouraged to follow low fat diet, no med change

## 2012-01-07 NOTE — Assessment & Plan Note (Signed)
Severe symptoms relatively acute presentation, needs brain scan, resisting at this time. encouraged to start aricept in the interim, spouse also made aware of pt's condition, and he is asked to help in assisting her to make the right health decisions

## 2012-02-20 ENCOUNTER — Other Ambulatory Visit: Payer: Self-pay | Admitting: Family Medicine

## 2012-04-22 ENCOUNTER — Telehealth: Payer: Self-pay | Admitting: Family Medicine

## 2012-04-22 MED ORDER — POTASSIUM CHLORIDE 20 MEQ/15ML (10%) PO LIQD: ORAL | Status: DC

## 2012-04-22 NOTE — Telephone Encounter (Signed)
Sent in

## 2012-04-25 ENCOUNTER — Other Ambulatory Visit: Payer: Self-pay | Admitting: Family Medicine

## 2012-05-25 ENCOUNTER — Other Ambulatory Visit: Payer: Self-pay | Admitting: Family Medicine

## 2012-07-26 ENCOUNTER — Other Ambulatory Visit: Payer: Self-pay | Admitting: Family Medicine

## 2012-07-29 ENCOUNTER — Other Ambulatory Visit: Payer: Self-pay | Admitting: Family Medicine

## 2012-08-19 ENCOUNTER — Other Ambulatory Visit: Payer: Self-pay | Admitting: Family Medicine

## 2012-09-11 ENCOUNTER — Ambulatory Visit (INDEPENDENT_AMBULATORY_CARE_PROVIDER_SITE_OTHER): Payer: Medicare Other | Admitting: Family Medicine

## 2012-09-11 ENCOUNTER — Encounter: Payer: Self-pay | Admitting: Family Medicine

## 2012-09-11 VITALS — BP 136/62 | HR 82 | Resp 18 | Ht 66.5 in | Wt 172.1 lb

## 2012-09-11 DIAGNOSIS — R159 Full incontinence of feces: Secondary | ICD-10-CM

## 2012-09-11 DIAGNOSIS — E785 Hyperlipidemia, unspecified: Secondary | ICD-10-CM

## 2012-09-11 DIAGNOSIS — F0390 Unspecified dementia without behavioral disturbance: Secondary | ICD-10-CM

## 2012-09-11 DIAGNOSIS — R5383 Other fatigue: Secondary | ICD-10-CM

## 2012-09-11 DIAGNOSIS — R5381 Other malaise: Secondary | ICD-10-CM

## 2012-09-11 DIAGNOSIS — R109 Unspecified abdominal pain: Secondary | ICD-10-CM

## 2012-09-11 DIAGNOSIS — R7301 Impaired fasting glucose: Secondary | ICD-10-CM

## 2012-09-11 DIAGNOSIS — R634 Abnormal weight loss: Secondary | ICD-10-CM

## 2012-09-11 DIAGNOSIS — I10 Essential (primary) hypertension: Secondary | ICD-10-CM

## 2012-09-11 NOTE — Patient Instructions (Addendum)
F/u in 4 weeks, please try to get your son to come to the next appointment along with your husband if that is ok with you both, that will be better  You need to  see the stomach specialist because of your bowel problems and weight loss  We will work together so that you feel better  Labs today please, CBC, cmp, TSH, cholesterol  Urinalysis in office today

## 2012-09-11 NOTE — Progress Notes (Signed)
  Subjective:    Patient ID: Angela Cordova, female    DOB: [birth year], 79 y.o.   MRN: [id number]  HPI Pt in with spouse after prolonged hiatus, crying, holding lower abdomen in pain, c/o lower abdominal pain, and rectal pain. Of note at the last visit, pt showed obvious dementia with very poor performance on the MMSE, spouse was unhappy with the dx and decided against medication at the time Today pt is obviously incapable of appropriately providing a history , and she relies on her spouse who is attentive Her mother had severe dementia also   Review of Systems    History from spouse as pt is incapable C/o poor appetite with weight loss C/o incontinence of stool Denies fever, chills, cough or head congestion Denies skin breakdown or falls Objective:   Physical Exam  Patient tearful, crying stating lower abdomen and rectum hurt and in no cardiopulmonary distress. Significant weight loss since last visit. Incapable of providing history  HEENT: No facial asymmetry, EOMI, no sinus tenderness,  oropharynx pink and moist.  Neck decreased  no adenopathy.  Chest: Clear to auscultation bilaterally.  CVS: S1, S2 no murmurs, no S3.  ABD: Soft no localized tenderness or guarding, no rebound,no organomegaly or masses, heme negative stool, fecal incontinence noted Ext: No edema  MS: Adequate ROM spine, shoulders, hips and knees.  Skin: Intact, no ulcerations or rash noted.  Psych: Good eye contact, . Memory markedly impaired, tearful,anxious and depressed appearing.  CNS: CN 2-12 intact, power, normal throughout.       Assessment & Plan:

## 2012-09-12 LAB — CBC WITH DIFFERENTIAL/PLATELET
Basophils Absolute: 0 10*3/uL (ref 0.0–0.1)
Basophils Relative: 0 % (ref 0–1)
Eosinophils Absolute: 0.1 10*3/uL (ref 0.0–0.7)
Hemoglobin: 12.8 g/dL (ref 12.0–15.0)
MCH: 25.4 pg — ABNORMAL LOW (ref 26.0–34.0)
MCHC: 34 g/dL (ref 30.0–36.0)
Monocytes Relative: 12 % (ref 3–12)
Neutrophils Relative %: 55 % (ref 43–77)
RDW: 15.8 % — ABNORMAL HIGH (ref 11.5–15.5)

## 2012-09-12 LAB — COMPREHENSIVE METABOLIC PANEL
Alkaline Phosphatase: 57 U/L (ref 39–117)
BUN: 9 mg/dL (ref 6–23)
Glucose, Bld: 109 mg/dL — ABNORMAL HIGH (ref 70–99)
Sodium: 138 mEq/L (ref 135–145)
Total Bilirubin: 0.9 mg/dL (ref 0.3–1.2)
Total Protein: 7.2 g/dL (ref 6.0–8.3)

## 2012-09-12 LAB — LIPID PANEL
LDL Cholesterol: 144 mg/dL — ABNORMAL HIGH (ref 0–99)
VLDL: 25 mg/dL (ref 0–40)

## 2012-09-17 ENCOUNTER — Other Ambulatory Visit: Payer: Self-pay | Admitting: Family Medicine

## 2012-09-17 ENCOUNTER — Telehealth: Payer: Self-pay | Admitting: Family Medicine

## 2012-09-17 MED ORDER — PANTOPRAZOLE SODIUM 20 MG PO TBEC: 20.0000 mg | DELAYED_RELEASE_TABLET | Freq: Every day | ORAL | Status: DC

## 2012-09-17 NOTE — Telephone Encounter (Signed)
Called an left message to have her husband to call me back

## 2012-09-17 NOTE — Telephone Encounter (Signed)
protonix sent to [facility] pls let them know, also keep appt with Dr Patty Sermons office

## 2012-09-19 ENCOUNTER — Encounter (INDEPENDENT_AMBULATORY_CARE_PROVIDER_SITE_OTHER): Payer: Self-pay | Admitting: Internal Medicine

## 2012-09-19 ENCOUNTER — Encounter: Payer: Self-pay | Admitting: Family Medicine

## 2012-09-19 ENCOUNTER — Ambulatory Visit (INDEPENDENT_AMBULATORY_CARE_PROVIDER_SITE_OTHER): Payer: 59 | Admitting: Internal Medicine

## 2012-09-19 VITALS — BP 110/80 | HR 80 | Ht 68.0 in | Wt 167.9 lb

## 2012-09-19 DIAGNOSIS — F0390 Unspecified dementia without behavioral disturbance: Secondary | ICD-10-CM

## 2012-09-19 DIAGNOSIS — R159 Full incontinence of feces: Secondary | ICD-10-CM

## 2012-09-19 NOTE — Patient Instructions (Addendum)
Imodium every other day. OV in 3 months. Keep appt with Dr. Lodema Hong

## 2012-09-19 NOTE — Progress Notes (Signed)
Subjective:     Patient ID: Angela Cordova, female   DOB: 04/10/1932, 79 y.o.   MRN: [id number]  HPI Presents today with c/o lower abdominal pain and pain in her rectum.  She also c/o of a discharge from her rectum. She has had symptoms for about off and on for a couple of months. No melena or bright red rectal bleeding. Her BMs are not normal. She has some constipation and take a stool softner. Her BMs are normal size.  She has some fecal incontinence In March of 2012 her weight was 178, Today her weight is 167.9. Appetite is good.   CMP     Component Value Date/Time   NA 138 09/11/2012 1525   K 3.1* 09/11/2012 1525   CL 98 09/11/2012 1525   CO2 28 09/11/2012 1525   GLUCOSE 109* 09/11/2012 1525   BUN 9 09/11/2012 1525   CREATININE 0.91 09/11/2012 1525   CREATININE 1.03 07/08/2010 1936   CALCIUM 9.5 09/11/2012 1525   PROT 7.2 09/11/2012 1525   ALBUMIN 4.2 09/11/2012 1525   AST 30 09/11/2012 1525   ALT 28 09/11/2012 1525   ALKPHOS 57 09/11/2012 1525   BILITOT 0.9 09/11/2012 1525    CBC    Component Value Date/Time   WBC 7.0 09/11/2012 1525   RBC 5.03 09/11/2012 1525   HGB 12.8 09/11/2012 1525   HCT 37.7 09/11/2012 1525   PLT 325 09/11/2012 1525   MCV 75.0* 09/11/2012 1525   MCH 25.4* 09/11/2012 1525   MCHC 34.0 09/11/2012 1525   RDW 15.8* 09/11/2012 1525   LYMPHSABS 2.3 09/11/2012 1525   MONOABS 0.9 09/11/2012 1525   EOSABS 0.1 09/11/2012 1525   BASOSABS 0.0 09/11/2012 1525      Colonoscopy 11/10/2009: Minimal internal hemorrhoids, single anal papilla, otherwise normal. Left sided diverticula. Polyps in the ascending colon status post biopsy and snare removal.   Biopsy:   1. COLON, POLYP(S), DISTAL TO ILEOCECAL VALVE : TWO TUBULAR ADENOMAS. NO HIGH GRADE DYSPLASIA OR MALIGNANCY IDENTIFIED.  DATE REPORTED: 11/11/2009   Electronically Signed Out by Luisa Hart M.D., Jonny Ruiz, Pathologist, Electronic Signature        Review of Systems see hpi Current Outpatient Prescriptions  Medication Sig  Dispense Refill  . amLODipine-atorvastatin (CADUET) 10-40 MG per tablet TAKE ONE TABLET BY MOUTH EVERY DAY  30 tablet  5  . aspirin (ECOTRIN LOW STRENGTH) 81 MG EC tablet Take 81 mg by mouth daily. Take one tablet by mouth once a day        . Calcium Carbonate-Vitamin D (TGT CALCIUM DIETARY SUPPLEMENT PO) Take by mouth.      Marland Kitchen CINNAMON PO Take 1,000 mg by mouth daily. Take one tablet by mouth once a day       . co-enzyme Q-10 50 MG capsule Take 50 mg by mouth daily.      . hydrochlorothiazide (HYDRODIURIL) 25 MG tablet TAKE ONE TABLET BY MOUTH EVERY DAY  120 tablet  3  . metoprolol succinate (TOPROL-XL) 25 MG 24 hr tablet TAKE ONE TABLET BY MOUTH ONCE DAILY.  30 tablet  5  . Multiple Vitamins-Minerals (CLINICAL NUTRIENTS 45-PLUS WMN) TABS Take by mouth. Take one tab by mouth once a day       . Omega 3-6-9 Fatty Acids (OMEGA-3 & OMEGA-6 FISH OIL PO) Take by mouth daily. Take one tablet by mouth daily         . pantoprazole (PROTONIX) 20 MG tablet Take 1 tablet (20 mg total) by  mouth daily.  30 tablet  1  . potassium chloride 20 MEQ/15ML (10%) solution Dilute 7.5 ml in 1 glass of water and increase dose to twice daily effective 09/12/2012  500 mL  11   Current Facility-Administered Medications  Medication Dose Route Frequency Provider Last Rate Last Dose  . Influenza (>/= 3 years) inactive virus vaccine (FLVIRIN/FLUZONE) injection SUSP 0.5 mL  0.5 mL Intramuscular Once Kerri Perches, MD       Past Medical History  Diagnosis Date  . Hypertension   . Hyperlipidemia   . Arthritis    History reviewed. No pertinent past surgical history. Allergies  Allergen Reactions  . Penicillins         Objective:   Physical Exam  Filed Vitals:   09/19/12 1114  BP: 110/80  Pulse: 80  Height: 5\' 8"  (1.727 m)  Weight: 167 lb 14.4 oz (76.159 kg)   Alert and oriented. Skin warm and dry. Oral mucosa is moist.   . Sclera anicteric, conjunctivae is pink. Thyroid not enlarged. No cervical  lymphadenopathy. Lungs clear. Heart regular rate and rhythm.  Abdomen is soft. Bowel sounds are positive. No hepatomegaly. No abdominal masses felt. No tenderness.  No edema to lower extremities.  Stool brown and guaiac negative.      Assessment:    Change in her stool. Fecal incontinence. Colonic neoplasm needs to be ruled. I discussed this case with Dr. Karilyn Cota. Patient seems to have some dementia going on.     Plan:    Imodium daily or every other day. I also discussed with Dr. Lodema Hong. Patient is demented. OV in 3 months. Keep appt with Dr. Lodema Hong.

## 2012-09-29 DIAGNOSIS — R109 Unspecified abdominal pain: Secondary | ICD-10-CM | POA: Insufficient documentation

## 2012-09-29 DIAGNOSIS — R159 Full incontinence of feces: Secondary | ICD-10-CM | POA: Insufficient documentation

## 2012-09-29 NOTE — Assessment & Plan Note (Signed)
Likely a part of her dementia, will have GI eval also

## 2012-09-29 NOTE — Assessment & Plan Note (Signed)
20 pound weight loss in 1 year, due to dementia I suspect, however will need GI eval to assist with r/o organic cause

## 2012-09-29 NOTE — Assessment & Plan Note (Signed)
Uncontrolled with iincreased cholesterol. No med change, low fat diet and continue current med

## 2012-09-29 NOTE — Assessment & Plan Note (Signed)
C/o lower abdominal pain, crying holding lower abdomen. Exam is benign as far as surgical abdomen, organomegaly or palpable mass. Rectal exam, heme negative stool with incontinence Gi to further evaluate

## 2012-09-29 NOTE — Assessment & Plan Note (Signed)
Controlled, no change in medication  

## 2012-09-29 NOTE — Assessment & Plan Note (Signed)
Marked physical deterioration since last seen over7 months ago. I believe her condition is much worse and hope that at the next visit the family will agree to brain scan as was recommended several months ago to r/o uny other underlying pathology, and also to medication Of note , pt's mother had sever dementia

## 2012-10-09 ENCOUNTER — Other Ambulatory Visit: Payer: Self-pay | Admitting: Family Medicine

## 2012-10-09 ENCOUNTER — Ambulatory Visit (INDEPENDENT_AMBULATORY_CARE_PROVIDER_SITE_OTHER): Payer: Medicare Other | Admitting: Family Medicine

## 2012-10-09 ENCOUNTER — Encounter: Payer: Self-pay | Admitting: Family Medicine

## 2012-10-09 VITALS — BP 122/70 | HR 82 | Resp 16 | Ht 66.5 in | Wt 170.0 lb

## 2012-10-09 DIAGNOSIS — K219 Gastro-esophageal reflux disease without esophagitis: Secondary | ICD-10-CM

## 2012-10-09 DIAGNOSIS — I10 Essential (primary) hypertension: Secondary | ICD-10-CM

## 2012-10-09 DIAGNOSIS — R109 Unspecified abdominal pain: Secondary | ICD-10-CM

## 2012-10-09 DIAGNOSIS — Z139 Encounter for screening, unspecified: Secondary | ICD-10-CM

## 2012-10-09 DIAGNOSIS — E785 Hyperlipidemia, unspecified: Secondary | ICD-10-CM

## 2012-10-09 DIAGNOSIS — F0390 Unspecified dementia without behavioral disturbance: Secondary | ICD-10-CM

## 2012-10-09 DIAGNOSIS — R159 Full incontinence of feces: Secondary | ICD-10-CM

## 2012-10-09 MED ORDER — PANTOPRAZOLE SODIUM 20 MG PO TBEC: 20.0000 mg | DELAYED_RELEASE_TABLET | Freq: Every day | ORAL | Status: DC

## 2012-10-09 MED ORDER — METOPROLOL SUCCINATE ER 25 MG PO TB24: ORAL_TABLET | ORAL | Status: DC

## 2012-10-09 MED ORDER — HYDROCHLOROTHIAZIDE 25 MG PO TABS: ORAL_TABLET | ORAL | Status: DC

## 2012-10-09 NOTE — Patient Instructions (Signed)
F/u in 3 month, call if you need me before  We will schedule your mammogram and give you the appointment when you are leaving  I am thankful, and dance with you since you are doing much better.  Continue medications you are taking, including imodium one every other day

## 2012-10-09 NOTE — Progress Notes (Signed)
  Subjective:    Patient ID: Angela Cordova, female    DOB: 29-Jan-1932, 79 y.o.   MRN: [id number]  HPI The PT is here for follow up and re-evaluation of chronic medical conditions, medication management and review of any available recent lab and radiology data.  Preventive health is updated, specifically  Cancer screening and Immunization.   Questions or concerns regarding consultations or procedures which the PT has had in the interim are  Addressed.Has seen GI and is pleased that sgtool incontinence is corrected, also denies abdominal pain The PT denies any adverse reactions to current medications since the last visit.  Situation re dementia not discussed with spouse or pt  At this visit however discussed with her son, who is aware and wants any help she can get, will re address at next visit       Review of Systems See HPI Denies recent fever or chills. Denies sinus pressure, nasal congestion, ear pain or sore throat. Denies chest congestion, productive cough or wheezing. Denies chest pains, palpitations and leg swelling Denies abdominal pain, nausea, vomiting,diarrhea or constipation.   Denies dysuria, frequency, hesitancy or incontinence. Denies joint pain, swelling and limitation in mobility. Denies headaches, seizures, numbness, or tingling. Denies depression, anxiety or insomnia. Denies skin break down or rash.        Objective:   Physical Exam  Patient alert and disoriented and in no cardiopulmonary distress.Happy and dancing at visit, with her spouse who is doing the same  HEENT: No facial asymmetry, EOMI, no sinus tenderness,  oropharynx pink and moist.  Neck supple no adenopathy.  Chest: Clear to auscultation bilaterally.  CVS: S1, S2 no murmurs, no S3.  ABD: Soft non tender. Bowel sounds normal.  Ext: No edema  MS: Adequate ROM spine, shoulders, hips and knees.  Skin: Intact, no ulcerations or rash noted.  Psych: Good eye contact, normal affect. Memory  loss , severe, not anxious or depressed appearing.  CNS: CN 2-12 intact, power, tone and sensation normal throughout.       Assessment & Plan:

## 2012-10-15 ENCOUNTER — Ambulatory Visit (HOSPITAL_COMMUNITY)
Admission: RE | Admit: 2012-10-15 | Discharge: 2012-10-15 | Disposition: A | Discharge status: Home / Self Care | Payer: Medicare Other | Source: Ambulatory Visit | Attending: Family Medicine | Admitting: Family Medicine

## 2012-10-15 DIAGNOSIS — Z139 Encounter for screening, unspecified: Secondary | ICD-10-CM

## 2012-10-15 DIAGNOSIS — Z1231 Encounter for screening mammogram for malignant neoplasm of breast: Secondary | ICD-10-CM | POA: Insufficient documentation

## 2012-10-19 DIAGNOSIS — K219 Gastro-esophageal reflux disease without esophagitis: Secondary | ICD-10-CM | POA: Insufficient documentation

## 2012-10-19 NOTE — Assessment & Plan Note (Signed)
Controlled, no change in medication  

## 2012-10-19 NOTE — Assessment & Plan Note (Signed)
Controlled, no change in medication DASH diet and commitment to daily physical activity for a minimum of 30 minutes discussed and encouraged, as a part of hypertension management. The importance of attaining a healthy weight is also discussed.  

## 2012-10-19 NOTE — Assessment & Plan Note (Signed)
Marked improvement with use of otc meds per Gi

## 2012-10-19 NOTE — Assessment & Plan Note (Signed)
addressed this with son who is aware of mother's condition. He is agreeable to medication use and this will be re addressed at next visit, she spould also have a brain scan

## 2012-10-19 NOTE — Assessment & Plan Note (Signed)
Fairly well Controlled, no change in medication Hyperlipidemia:Low fat diet discussed and encouraged.

## 2012-12-10 ENCOUNTER — Other Ambulatory Visit: Payer: Self-pay | Admitting: Family Medicine

## 2012-12-18 ENCOUNTER — Telehealth: Payer: Self-pay | Admitting: Family Medicine

## 2012-12-18 NOTE — Telephone Encounter (Signed)
Returned Angela Cordova's called and she also told me to cancel the apt. apt. Has been cancelled.

## 2012-12-23 ENCOUNTER — Ambulatory Visit (INDEPENDENT_AMBULATORY_CARE_PROVIDER_SITE_OTHER): Payer: 59 | Admitting: Internal Medicine

## 2013-01-15 ENCOUNTER — Encounter: Payer: Self-pay | Admitting: Family Medicine

## 2013-01-15 ENCOUNTER — Ambulatory Visit (INDEPENDENT_AMBULATORY_CARE_PROVIDER_SITE_OTHER): Payer: Medicare Other | Admitting: Family Medicine

## 2013-01-15 VITALS — BP 120/64 | HR 80 | Resp 18 | Ht 66.5 in | Wt 170.0 lb

## 2013-01-15 DIAGNOSIS — R159 Full incontinence of feces: Secondary | ICD-10-CM

## 2013-01-15 DIAGNOSIS — I10 Essential (primary) hypertension: Secondary | ICD-10-CM

## 2013-01-15 DIAGNOSIS — F0280 Dementia in other diseases classified elsewhere without behavioral disturbance: Secondary | ICD-10-CM

## 2013-01-15 DIAGNOSIS — E785 Hyperlipidemia, unspecified: Secondary | ICD-10-CM

## 2013-01-15 DIAGNOSIS — R7301 Impaired fasting glucose: Secondary | ICD-10-CM

## 2013-01-15 DIAGNOSIS — Z23 Encounter for immunization: Secondary | ICD-10-CM

## 2013-01-15 DIAGNOSIS — R5381 Other malaise: Secondary | ICD-10-CM

## 2013-01-15 DIAGNOSIS — F0390 Unspecified dementia without behavioral disturbance: Secondary | ICD-10-CM

## 2013-01-15 MED ORDER — HYDROCHLOROTHIAZIDE 25 MG PO TABS: ORAL_TABLET | ORAL | Status: DC

## 2013-01-15 MED ORDER — PANTOPRAZOLE SODIUM 20 MG PO TBEC: 20.0000 mg | DELAYED_RELEASE_TABLET | Freq: Every day | ORAL | Status: DC

## 2013-01-15 MED ORDER — AMLODIPINE-ATORVASTATIN 10-40 MG PO TABS: ORAL_TABLET | ORAL | Status: DC

## 2013-01-15 MED ORDER — METOPROLOL SUCCINATE ER 25 MG PO TB24: ORAL_TABLET | ORAL | Status: DC

## 2013-01-15 NOTE — Patient Instructions (Addendum)
Annual wellness in mid January, call if you need me before  Flu vaccine today  Fasting lipid, cmp and hBA1C  First week in November  pLEASE consider starting patches to help with your memory, you do need them

## 2013-01-15 NOTE — Progress Notes (Signed)
  Subjective:    Patient ID: Angela Cordova, female    DOB: 01-22-1932, 79 y.o.   MRN: [id number]  HPI    Review of Systems     Objective:   Physical Exam        Assessment & Plan:

## 2013-01-19 NOTE — Assessment & Plan Note (Signed)
Progressively worsening, pt and spouse  In particular are still resisting treatment, reports intolerance to aricept, encouragedto try the exelon aptch, "will think about it"

## 2013-01-19 NOTE — Assessment & Plan Note (Signed)
Controlled , no med change °

## 2013-01-19 NOTE — Assessment & Plan Note (Signed)
Improved with increased fiber intake

## 2013-01-19 NOTE — Assessment & Plan Note (Signed)
Controlled carb intake encouraged. Updated lab next visit

## 2013-01-19 NOTE — Assessment & Plan Note (Signed)
Updated lab at next visit. Hyperlipidemia:Low fat diet discussed and encouraged.

## 2013-03-07 LAB — HEMOGLOBIN A1C
Hgb A1c MFr Bld: 6.1 % — ABNORMAL HIGH (ref <5.7)
Mean Plasma Glucose: 128 mg/dL — ABNORMAL HIGH (ref <117)

## 2013-03-07 LAB — COMPREHENSIVE METABOLIC PANEL
Alkaline Phosphatase: 60 U/L (ref 39–117)
BUN: 13 mg/dL (ref 6–23)
CO2: 30 mEq/L (ref 19–32)
Glucose, Bld: 95 mg/dL (ref 70–99)
Sodium: 140 mEq/L (ref 135–145)
Total Bilirubin: 0.9 mg/dL (ref 0.3–1.2)
Total Protein: 6.7 g/dL (ref 6.0–8.3)

## 2013-03-07 LAB — LIPID PANEL
Cholesterol: 190 mg/dL (ref 0–200)
HDL: 51 mg/dL (ref >39)
Total CHOL/HDL Ratio: 3.7 Ratio
Triglycerides: 97 mg/dL (ref <150)

## 2013-03-11 ENCOUNTER — Other Ambulatory Visit: Payer: Self-pay | Admitting: Family Medicine

## 2013-03-12 ENCOUNTER — Other Ambulatory Visit: Payer: Self-pay | Admitting: Family Medicine

## 2013-04-11 ENCOUNTER — Other Ambulatory Visit: Payer: Self-pay | Admitting: Family Medicine

## 2013-05-13 ENCOUNTER — Encounter (INDEPENDENT_AMBULATORY_CARE_PROVIDER_SITE_OTHER): Payer: Self-pay

## 2013-05-13 ENCOUNTER — Encounter: Payer: Self-pay | Admitting: Family Medicine

## 2013-05-13 ENCOUNTER — Ambulatory Visit (INDEPENDENT_AMBULATORY_CARE_PROVIDER_SITE_OTHER): Payer: Medicare Other | Admitting: Family Medicine

## 2013-05-13 VITALS — BP 128/70 | HR 75 | Resp 16 | Ht 66.5 in | Wt 171.1 lb

## 2013-05-13 DIAGNOSIS — Z0000 Encounter for general adult medical examination without abnormal findings: Secondary | ICD-10-CM

## 2013-05-13 NOTE — Progress Notes (Signed)
Subjective:    Patient ID: Angela Cordova, female    DOB: 23-Mar-1932, 80 y.o.   MRN: [id number]  HPI  Preventive Screening-Counseling & Management   Patient present here today for a Medicare annual wellness visit.Spouse brought her as usual, however pt specifically requested he leave the room, needs her son present for certain issues as memory and mental state of spouse is also questionable in my opinion , he however is not my pt so not sure of this, however his insight into his wife's dementia is lacking and he continues to refuse in a poliute way testing or treatment which is unfortunate.   Current Problems (verified)   Medications Prior to Visit Allergies (verified)   PAST HISTORY  Family History: 2 sisters living , both parents deceased , Mom had dementia  Social History Married, one son, never cigarette, alcohol or street drugs   Risk Factors  Current exercise habits:  None consistently , needs to commit to 30 mins of daily exercise  Dietary issues discussed:vegetable , and  Intake and a lot of water   Cardiac risk factors: none significant   Depression Screen  (Note: if answer to either of the following is "Yes", a more complete depression screening is indicated)   Over the past two weeks, have you felt down, depressed or hopeless? No  Over the past two weeks, have you felt little interest or pleasure in doing things? No  Have you lost interest or pleasure in daily life? No  Do you often feel hopeless? No  Do you cry easily over simple problems? No   Activities of Daily Living  In your present state of health, do you have any difficulty performing the following activities?  Driving?: Incapable due to poor memory Managing money?: incapable due to severe dementia Feeding yourself?:No Getting from bed to chair?:No Climbing a flight of stairs?:No Preparing food and eating?:Nshould not cook, able to feed herself Bathing or showering?:No Getting dressed?:No Getting  to the toilet?:No Using the toilet?:No Moving around from place to place?: No  Fall Risk Assessment In the past year have you fallen or had a near fall?:yes, reports falling on a wet floor in [place], however incapable of accxurate reporting will need this re evaluated next visit Are you currently taking any medications that make you dizzy?:No   Hearing Difficulties: No Do you often ask people to speak up or repeat themselves?:No Do you experience ringing or noises in your ears?:No Do you have difficulty understanding soft or whispered voices?:No  Cognitive Testing  Alert? Yes Normal Appearance?Yes  Oriented to person? Yes Place? no  Time? no  Displays appropriate judgment?no  Can read the correct time from a watch face? no Are you having problems remembering things?yes severe dementia which to date is not being treated, spouse is a major block to this and hope to get son involved, who had [peripherally stated he was "all for " Mother being treated  Advanced Directives have been discussed with the patient?Incapable of addressing this , will need to discuss when spouse and /son are present   List the Names of Other Physician/Practitioners you currently use:    Indicate any recent Medical Services you may have received from other than [facility] providers in the past year (date may be approximate).   Assessment:    Annual Wellness Exam   Plan:    During the course of the visit the patient was educated and counseled about appropriate screening and preventive services including:  A  healthy diet is rich in fruit, vegetables and whole grains. Poultry fish, nuts and beans are a healthy choice for protein rather then red meat. A low sodium diet and drinking 64 ounces of water daily is generally recommended. Oils and sweet should be limited. Carbohydrates especially for those who are diabetic or overweight, should be limited to 30-45 gram per meal. It is important to eat on a regular schedule,  at least 3 times daily. Snacks should be primarily fruits, vegetables or nuts. It is important that you exercise regularly at least 30 minutes 5 times a week. If you develop chest pain, have severe difficulty breathing, or feel very tired, stop exercising immediately and seek medical attention  Immunization reviewed and updated. Cancer screening reviewed and updated    Patient Instructions (the written plan) was given to the patient.  Medicare Attestation  I have personally reviewed:  The patient's medical and social history  Their use of alcohol, tobacco or illicit drugs  Their current medications and supplements  The patient's functional ability including ADLs,fall risks, home safety risks, cognitive, and hearing and visual impairment  Diet and physical activities  Evidence for depression or mood disorders  The patient's weight, height, BMI, and visual acuity have been recorded in the chart. I have made referrals, counseling, and provided education to the patient based on review of the above and I have provided the patient with a written personalized care plan for preventive services.     Review of Systems     Objective:   Physical Exam        Assessment & Plan:

## 2013-05-13 NOTE — Patient Instructions (Signed)
F/u in 4 month, and please bring your son. Please sign for your son to help with decision making   My biggest concern is your memory, and this can be helped with medication  Otherwise you are doing very well  Be careful  Not to fall any more

## 2013-05-15 ENCOUNTER — Other Ambulatory Visit: Payer: Self-pay

## 2013-05-15 MED ORDER — HYDROCHLOROTHIAZIDE 25 MG PO TABS: ORAL_TABLET | ORAL | Status: DC

## 2013-05-15 MED ORDER — AMLODIPINE-ATORVASTATIN 10-40 MG PO TABS: ORAL_TABLET | ORAL | Status: DC

## 2013-05-18 DIAGNOSIS — Z0000 Encounter for general adult medical examination without abnormal findings: Secondary | ICD-10-CM | POA: Insufficient documentation

## 2013-05-18 NOTE — Assessment & Plan Note (Signed)
Annual wellness as documented . Pt needs the input of her adult son, she has severe dementia and is still on no medication and has never really tried any, primarily due to lack of interest in treatment or lack of acceptance by her elderly spouse. End of life issues not addressed, pt incapable without responsible family member present

## 2013-05-30 ENCOUNTER — Other Ambulatory Visit: Payer: Self-pay | Admitting: Family Medicine

## 2013-09-02 ENCOUNTER — Encounter (INDEPENDENT_AMBULATORY_CARE_PROVIDER_SITE_OTHER): Payer: Self-pay

## 2013-09-02 ENCOUNTER — Ambulatory Visit (INDEPENDENT_AMBULATORY_CARE_PROVIDER_SITE_OTHER): Payer: Medicare Other | Admitting: Family Medicine

## 2013-09-02 ENCOUNTER — Encounter: Payer: Self-pay | Admitting: Family Medicine

## 2013-09-02 VITALS — BP 128/74 | HR 80 | Resp 18 | Ht 66.5 in | Wt 172.1 lb

## 2013-09-02 DIAGNOSIS — R7301 Impaired fasting glucose: Secondary | ICD-10-CM

## 2013-09-02 DIAGNOSIS — G309 Alzheimer's disease, unspecified: Principal | ICD-10-CM

## 2013-09-02 DIAGNOSIS — Z13820 Encounter for screening for osteoporosis: Secondary | ICD-10-CM

## 2013-09-02 DIAGNOSIS — I10 Essential (primary) hypertension: Secondary | ICD-10-CM

## 2013-09-02 DIAGNOSIS — F0280 Dementia in other diseases classified elsewhere without behavioral disturbance: Secondary | ICD-10-CM

## 2013-09-02 DIAGNOSIS — E785 Hyperlipidemia, unspecified: Secondary | ICD-10-CM

## 2013-09-02 MED ORDER — DONEPEZIL HCL 5 MG PO TABS: 5.0000 mg | ORAL_TABLET | Freq: Every day | ORAL | Status: DC

## 2013-09-02 MED ORDER — PANTOPRAZOLE SODIUM 20 MG PO TBEC: 20.0000 mg | DELAYED_RELEASE_TABLET | Freq: Every day | ORAL | Status: DC

## 2013-09-02 NOTE — Patient Instructions (Addendum)
F/u in 2 month, call if you need me before  New is for arice[facility] at [facility], call and stop if problems. Main c/o loose stool   Fasting lipid, cmp, HBa1c, CBC, TSH in 2 months, before visit  Blood pressure is excellent and you look well

## 2013-09-10 ENCOUNTER — Ambulatory Visit (HOSPITAL_COMMUNITY)
Admission: RE | Admit: 2013-09-10 | Discharge: 2013-09-10 | Disposition: A | Discharge status: Home / Self Care | Payer: Medicare Other | Source: Ambulatory Visit | Attending: Family Medicine | Admitting: Family Medicine

## 2013-09-10 DIAGNOSIS — Z13820 Encounter for screening for osteoporosis: Secondary | ICD-10-CM

## 2013-09-10 DIAGNOSIS — Z780 Asymptomatic menopausal state: Secondary | ICD-10-CM | POA: Insufficient documentation

## 2013-09-15 ENCOUNTER — Telehealth: Payer: Self-pay | Admitting: Family Medicine

## 2013-09-15 ENCOUNTER — Other Ambulatory Visit: Payer: Self-pay

## 2013-09-15 MED ORDER — POTASSIUM CHLORIDE 20 MEQ/15ML (10%) PO LIQD: ORAL | Status: DC

## 2013-09-15 NOTE — Telephone Encounter (Signed)
Med refilled.

## 2013-09-23 ENCOUNTER — Other Ambulatory Visit: Payer: Self-pay

## 2013-09-23 MED ORDER — POTASSIUM CHLORIDE 20 MEQ/15ML (10%) PO LIQD: ORAL | Status: DC

## 2013-11-07 ENCOUNTER — Other Ambulatory Visit: Payer: Self-pay | Admitting: Family Medicine

## 2013-11-14 ENCOUNTER — Other Ambulatory Visit: Payer: Self-pay | Admitting: Family Medicine

## 2013-11-21 LAB — CBC
HCT: 35 % — ABNORMAL LOW (ref 36.0–46.0)
Hemoglobin: 12.3 g/dL (ref 12.0–15.0)
MCH: 26.6 pg (ref 26.0–34.0)
MCHC: 35.1 g/dL (ref 30.0–36.0)
MCV: 75.8 fL — ABNORMAL LOW (ref 78.0–100.0)
Platelets: 237 10*3/uL (ref 150–400)
RBC: 4.62 MIL/uL (ref 3.87–5.11)
RDW: 15.4 % (ref 11.5–15.5)
WBC: 4.4 10*3/uL (ref 4.0–10.5)

## 2013-11-22 LAB — COMPREHENSIVE METABOLIC PANEL
ALK PHOS: 50 U/L (ref 39–117)
ALT: 11 U/L (ref 0–35)
AST: 17 U/L (ref 0–37)
Albumin: 4.5 g/dL (ref 3.5–5.2)
BUN: 8 mg/dL (ref 6–23)
CO2: 29 mEq/L (ref 19–32)
CREATININE: 0.96 mg/dL (ref 0.50–1.10)
Calcium: 9.5 mg/dL (ref 8.4–10.5)
Chloride: 101 mEq/L (ref 96–112)
Glucose, Bld: 99 mg/dL (ref 70–99)
POTASSIUM: 3.8 meq/L (ref 3.5–5.3)
Sodium: 140 mEq/L (ref 135–145)
Total Bilirubin: 0.9 mg/dL (ref 0.2–1.2)
Total Protein: 6.9 g/dL (ref 6.0–8.3)

## 2013-11-22 LAB — LIPID PANEL
CHOL/HDL RATIO: 3 ratio
CHOLESTEROL: 155 mg/dL (ref 0–200)
HDL: 51 mg/dL (ref >39)
LDL Cholesterol: 85 mg/dL (ref 0–99)
Triglycerides: 94 mg/dL (ref <150)
VLDL: 19 mg/dL (ref 0–40)

## 2013-11-22 LAB — HEMOGLOBIN A1C
Hgb A1c MFr Bld: 6 % — ABNORMAL HIGH (ref <5.7)
MEAN PLASMA GLUCOSE: 126 mg/dL — AB (ref <117)

## 2013-11-22 LAB — TSH: TSH: 2.26 u[IU]/mL (ref 0.350–4.500)

## 2013-11-22 NOTE — Assessment & Plan Note (Signed)
Uncontrolled, leevated LDL Hyperlipidemia:Low fat diet discussed and encouraged.  Updated lab needed at/ before next visit.

## 2013-11-22 NOTE — Progress Notes (Signed)
   Subjective:    Patient ID: Angela Cordova, female    DOB: 07/29/1931, 80 y.o.   MRN: [id number]  HPI The PT is here for follow up and re-evaluation of chronic medical conditions, medication management and review of any available recent lab and radiology data.  Preventive health is updated, specifically  Cancer screening and Immunization.    The PT's family  denies any adverse reactions to current medications since the last visit.  There are no new concerns.  There are no specific complaints  Pt incapable of providing a history due to severe dementia, her spouse and son accompany her and providse the history      Review of Systems See HPI Denies recent fever or chills. Denies , nasal congestion Denies chest congestion, productive cough Denies orthopnea and leg swelling Denies abdominal pain, nausea, vomiting,diarrhea or constipation. Appetite is good Denies  frequency,  Or malodorous urine , some incontinence developing, likely related to her denmentia   Denies skin break down or rash.        Objective:   Physical Exam BP 128/74  Pulse 80  Resp 18  Ht 5' 6.5" (1.689 m)  Wt 172 lb 1.3 oz (78.055 kg)  BMI 27.36 kg/m2  SpO2 98% Patient alert, pleasantly confused, disoriented , which is her baseline , in c/p distress at rest or with activity  HEENT: No facial asymmetry, EOMI,   oropharynx pink and moist.  Neck supple no JVD, no mass.  Chest: Clear to auscultation bilaterally.  CVS: S1, S2 no murmurs, no S3.Regular rate.  ABD: Soft non tender.   Ext: No edema  MS: Adequate ROM spine, shoulders, hips and knees.  Skin: Intact, no ulcerations or rash noted.  Psych: Good eye contact, , severe  Memory loss not anxious or depressed appearing.  CNS: CN 2-12 intact, power,  normal throughout.        Assessment & Plan:  Alzheimer's disease pt to start aricept , her son is present , and along with her husband, they support this decision , she had tried this in  the past but "was intolerant" at the time her spouse was not in favor of her taking hte medication, hopefully a "fair trial" will be given as pt does have severe dementia Reviewed adverse s/e and advised family to call if pt experienced a problem with the medication  HYPERTENSION Controlled, no change in medication   IMPAIRED FASTING GLUCOSE Updated lab needed at/ before next visit. Patient and family  educated about the importance of limiting  Carbohydrate and swet intake and regular physical  Activity as well as healthy weight to reduce the development of diabetes  HYPERLIPIDEMIA Uncontrolled, leevated LDL Hyperlipidemia:Low fat diet discussed and encouraged.  Updated lab needed at/ before next visit.

## 2013-11-22 NOTE — Assessment & Plan Note (Signed)
pt to start aricept , her son is present , and along with her husband, they support this decision , she had tried this in the past but "was intolerant" at the time her spouse was not in favor of her taking hte medication, hopefully a "fair trial" will be given as pt does have severe dementia Reviewed adverse s/e and advised family to call if pt experienced a problem with the medication

## 2013-11-22 NOTE — Assessment & Plan Note (Signed)
Controlled, no change in medication  

## 2013-11-22 NOTE — Assessment & Plan Note (Signed)
Updated lab needed at/ before next visit. Patient and family  educated about the importance of limiting  Carbohydrate and swet intake and regular physical  Activity as well as healthy weight to reduce the development of diabetes

## 2013-11-26 ENCOUNTER — Other Ambulatory Visit: Payer: Self-pay | Admitting: Family Medicine

## 2013-12-03 ENCOUNTER — Other Ambulatory Visit: Payer: Self-pay | Admitting: Family Medicine

## 2013-12-03 DIAGNOSIS — Z1231 Encounter for screening mammogram for malignant neoplasm of breast: Secondary | ICD-10-CM

## 2013-12-08 ENCOUNTER — Ambulatory Visit (HOSPITAL_COMMUNITY)
Admission: RE | Admit: 2013-12-08 | Discharge: 2013-12-08 | Disposition: A | Discharge status: Home / Self Care | Payer: Medicare Other | Source: Ambulatory Visit | Attending: Family Medicine | Admitting: Family Medicine

## 2013-12-08 DIAGNOSIS — Z1231 Encounter for screening mammogram for malignant neoplasm of breast: Secondary | ICD-10-CM | POA: Insufficient documentation

## 2013-12-25 ENCOUNTER — Encounter: Payer: Self-pay | Admitting: Family Medicine

## 2013-12-25 ENCOUNTER — Ambulatory Visit (INDEPENDENT_AMBULATORY_CARE_PROVIDER_SITE_OTHER): Payer: Medicare Other | Admitting: Family Medicine

## 2013-12-25 VITALS — BP 140/64 | HR 94 | Resp 18 | Ht 66.5 in | Wt 157.0 lb

## 2013-12-25 DIAGNOSIS — E785 Hyperlipidemia, unspecified: Secondary | ICD-10-CM

## 2013-12-25 DIAGNOSIS — Z23 Encounter for immunization: Secondary | ICD-10-CM

## 2013-12-25 DIAGNOSIS — F0280 Dementia in other diseases classified elsewhere without behavioral disturbance: Secondary | ICD-10-CM

## 2013-12-25 DIAGNOSIS — R7301 Impaired fasting glucose: Secondary | ICD-10-CM

## 2013-12-25 DIAGNOSIS — I10 Essential (primary) hypertension: Secondary | ICD-10-CM

## 2013-12-25 DIAGNOSIS — G309 Alzheimer's disease, unspecified: Principal | ICD-10-CM

## 2013-12-25 NOTE — Assessment & Plan Note (Signed)
Controlled, no change in medication  

## 2013-12-25 NOTE — Patient Instructions (Signed)
F/u in 4 month, call if you need me before  No med changes    Flu vaccine today

## 2013-12-28 DIAGNOSIS — Z23 Encounter for immunization: Secondary | ICD-10-CM | POA: Insufficient documentation

## 2013-12-28 NOTE — Assessment & Plan Note (Signed)
Improved Patient and spouse  educated about the importance of limiting  Carbohydrate and sweet  intake , The fact that changes in all these areas will reduce or eliminate all together the development of diabetes is stressed.

## 2013-12-28 NOTE — Assessment & Plan Note (Signed)
Controlled, no change in medication  

## 2013-12-28 NOTE — Progress Notes (Signed)
   Subjective:    Patient ID: Angela Cordova, female    DOB: 03-Jul-1931, 80 y.o.   MRN: [id number]  HPI The PT is here for follow up and re-evaluation of chronic medical conditions, medication management and review of any available recent lab and radiology data.  Preventive health is updated, specifically   Immunization.   . The PT denies any adverse reactions to current medications since the last visit.  There are no new concerns.  There are no specific complaints       Review of Systems See HPI History is from her husband, pt has severe dementia Denies recent fever or chills. Denies sinus pressure, nasal congestion, ear pain or sore throat. Denies chest congestion, productive cough or wheezing. Denies chest pains, palpitations and leg swelling Denies abdominal pain, nausea, vomiting,diarrhea or constipation.   Denies dysuria, frequency, hesitancy or incontinence. Denies joint pain, swelling and limitation in mobility. Denies headaches, seizures, numbness, or tingling. Denies depression, anxiety or insomnia. Denies skin break down or rash.        Objective:   Physical Exam  BP 140/64  Pulse 94  Resp 18  Ht 5' 6.5" (1.689 m)  Wt 157 lb 0.6 oz (71.233 kg)  BMI 24.97 kg/m2  SpO2 99% Patient alert and in no cardiopulmonary distress.  HEENT: No facial asymmetry, EOMI,   oropharynx pink and moist.  Neck supple no JVD, no mass.  Chest: Clear to auscultation bilaterally.  CVS: S1, S2 no murmurs, no S3.Regular rate.  ABD: Soft non tender.   Ext: No edema  MS: Adequate ROM spine, shoulders, hips and knees.  Skin: Intact, no ulcerations or rash noted.  Psych: Good eye contact, flat affect. Memory loss not anxious or depressed appearing.  CNS: CN 2-12 intact, power,  normal throughout.no focal deficits noted.       Assessment & Plan:  HYPERTENSION Controlled, no change in medication   Alzheimer's disease Tolerating an d now taking the  dose of tablet,  intend to increase her to the  dose next visit  IMPAIRED FASTING GLUCOSE Improved Patient and spouse  educated about the importance of limiting  Carbohydrate and sweet  intake , The fact that changes in all these areas will reduce or eliminate all together the development of diabetes is stressed.     HYPERLIPIDEMIA Controlled, no change in medication   Need for prophylactic vaccination and inoculation against influenza Vaccine administered at visit.

## 2013-12-28 NOTE — Assessment & Plan Note (Signed)
Tolerating an d now taking the  dose of tablet, intend to increase her to the  dose next visit

## 2013-12-28 NOTE — Assessment & Plan Note (Signed)
Vaccine administered at visit.  

## 2014-01-08 ENCOUNTER — Other Ambulatory Visit: Payer: Self-pay | Admitting: Family Medicine

## 2014-02-20 ENCOUNTER — Other Ambulatory Visit: Payer: Self-pay | Admitting: Family Medicine

## 2014-04-07 ENCOUNTER — Other Ambulatory Visit: Payer: Self-pay | Admitting: Family Medicine

## 2014-05-04 ENCOUNTER — Encounter: Payer: Self-pay | Admitting: Family Medicine

## 2014-05-04 ENCOUNTER — Ambulatory Visit (INDEPENDENT_AMBULATORY_CARE_PROVIDER_SITE_OTHER): Payer: Medicare Other | Admitting: Family Medicine

## 2014-05-04 ENCOUNTER — Encounter (INDEPENDENT_AMBULATORY_CARE_PROVIDER_SITE_OTHER): Payer: Self-pay

## 2014-05-04 VITALS — BP 124/74 | HR 85 | Resp 16 | Ht 67.0 in | Wt 148.0 lb

## 2014-05-04 DIAGNOSIS — F0280 Dementia in other diseases classified elsewhere without behavioral disturbance: Secondary | ICD-10-CM

## 2014-05-04 DIAGNOSIS — E785 Hyperlipidemia, unspecified: Secondary | ICD-10-CM

## 2014-05-04 DIAGNOSIS — R634 Abnormal weight loss: Secondary | ICD-10-CM

## 2014-05-04 DIAGNOSIS — I10 Essential (primary) hypertension: Secondary | ICD-10-CM

## 2014-05-04 DIAGNOSIS — Z23 Encounter for immunization: Secondary | ICD-10-CM | POA: Insufficient documentation

## 2014-05-04 DIAGNOSIS — G309 Alzheimer's disease, unspecified: Secondary | ICD-10-CM

## 2014-05-04 DIAGNOSIS — Z1211 Encounter for screening for malignant neoplasm of colon: Secondary | ICD-10-CM

## 2014-05-04 LAB — HEMOCCULT GUIAC POC 1CARD (OFFICE): Fecal Occult Blood, POC: NEGATIVE

## 2014-05-04 MED ORDER — AMLODIPINE-ATORVASTATIN 10-40 MG PO TABS: 1.0000 | ORAL_TABLET | Freq: Every day | ORAL | Status: DC

## 2014-05-04 MED ORDER — DONEPEZIL HCL 10 MG PO TABS: 10.0000 mg | ORAL_TABLET | Freq: Every day | ORAL | Status: DC

## 2014-05-04 NOTE — Progress Notes (Signed)
   Subjective:    Patient ID: Angela Cordova, female    DOB: [birth year], 81 y.o.   MRN: [id number]  HPI The PT is here for follow up and re-evaluation of chronic medical conditions, medication management and review of any available recent lab and radiology data.  Preventive health is updated, specifically  Cancer screening and Immunization.   . The PT's spouse  denies any adverse reactions to current medications since the last visit.  Poor intake and loss of weight is a concern  There are no specific complaints       Review of Systems See HPI History is from spouse as pt is unable Denies fever , chills , cough , runny nose Denies malodorous urine or constipation Denies falls  Or instability Denies poor impulse control or behavioral disturbance     Objective:   Physical Exam  BP 124/74 mmHg  Pulse 85  Resp 16  Ht  (1.702 m)  Wt 148 lb (67.132 kg)  BMI 23.17 kg/m2  SpO2 98% Patient alert and in no cardiopulmonary distress.  HEENT: No facial asymmetry, EOMI,   oropharynx pink and moist.  Neck supple no JVD, no mass.  Chest: Clear to auscultation bilaterally.  CVS: S1, S2 no murmurs, no S3.Regular rate.  ABD: Soft non tender.No organomegaly or mass. Normal BS Rectal: no mass, heme negative stool   Ext: No edema  MS: Adequate ROM spine, shoulders, hips and knees.  Skin: Intact, no ulcerations or rash noted.  Psych: Good eye contact, blunted  affect. Memory loss, severe, not anxious or depressed appearing.  CNS: CN 2-12 intact, power,  normal throughout.no focal deficits noted.       Assessment & Plan:  Essential hypertension Controlled, no change in medication    Alzheimer's disease Pt with 9 pound weigtht loss which may well be attributed to her disease Increase aricept to standard treating dose since tolerating well No behavioral disturbance reported, and pt remains pleasant and seems content Spouse is extremely supportive and cares for her and  her son is also involved   Need for vaccination with 13-polyvalent pneumococcal conjugate vaccine Vaccine administered at visit.    Hyperlipemia Improved though LDL still above goal No med change Hyperlipidemia:Low fat diet discussed and encouraged.     Special screening for malignant neoplasms, colon No mass, heme negative stool   Weight loss, unintentional Pt and spouse are encouraged to have Angela Cordova increase food intake to reverse this. Supplements are also encouraged. Angela Cordova unable to comprehend due to her illness but spouse understands. Cloes follow up planned

## 2014-05-04 NOTE — Patient Instructions (Addendum)
F/u in 3.5 month, call if you need me before  Rectal exam today is good.  You need to eat mor often and increase amount you eat, since you have lost 9 pounds since you were last here  Supplement with ensure 1 can daily if needed.   Prevnar today  Fasting lipid, cmp and CBC within the next  week  Keep taking metoprolol as you are doing  Increase in aricept dose for your memory when you next get this filled, to treating dose of 10 mg daily, thankful that you are taking this  All the best for [id number]

## 2014-05-04 NOTE — Assessment & Plan Note (Signed)
Controlled, no change in medication  

## 2014-05-08 ENCOUNTER — Other Ambulatory Visit: Payer: Self-pay | Admitting: Family Medicine

## 2014-05-08 LAB — CBC
HEMATOCRIT: 37.2 % (ref 36.0–46.0)
HEMOGLOBIN: 12.5 g/dL (ref 12.0–15.0)
MCH: 26.5 pg (ref 26.0–34.0)
MCHC: 33.6 g/dL (ref 30.0–36.0)
MCV: 79 fL (ref 78.0–100.0)
MPV: 8.7 fL (ref 8.6–12.4)
Platelets: 242 10*3/uL (ref 150–400)
RBC: 4.71 MIL/uL (ref 3.87–5.11)
RDW: 15.3 % (ref 11.5–15.5)
WBC: 4.1 10*3/uL (ref 4.0–10.5)

## 2014-05-08 LAB — COMPREHENSIVE METABOLIC PANEL
ALBUMIN: 4 g/dL (ref 3.5–5.2)
ALT: 10 U/L (ref 0–35)
AST: 14 U/L (ref 0–37)
Alkaline Phosphatase: 45 U/L (ref 39–117)
BILIRUBIN TOTAL: 0.6 mg/dL (ref 0.2–1.2)
BUN: 12 mg/dL (ref 6–23)
CO2: 30 meq/L (ref 19–32)
Calcium: 9.1 mg/dL (ref 8.4–10.5)
Chloride: 101 mEq/L (ref 96–112)
Creat: 0.88 mg/dL (ref 0.50–1.10)
GLUCOSE: 104 mg/dL — AB (ref 70–99)
POTASSIUM: 3.6 meq/L (ref 3.5–5.3)
SODIUM: 142 meq/L (ref 135–145)
TOTAL PROTEIN: 6.5 g/dL (ref 6.0–8.3)

## 2014-05-08 LAB — LIPID PANEL
Cholesterol: 187 mg/dL (ref 0–200)
HDL: 68 mg/dL (ref >39)
LDL Cholesterol: 108 mg/dL — ABNORMAL HIGH (ref 0–99)
Total CHOL/HDL Ratio: 2.8 Ratio
Triglycerides: 54 mg/dL (ref <150)
VLDL: 11 mg/dL (ref 0–40)

## 2014-05-24 DIAGNOSIS — R634 Abnormal weight loss: Secondary | ICD-10-CM | POA: Insufficient documentation

## 2014-05-24 NOTE — Assessment & Plan Note (Signed)
Pt and spouse are encouraged to have Shakthi increase food intake to reverse this. Supplements are also encouraged. Anjela unable to comprehend due to her illness but spouse understands. Cloes follow up planned

## 2014-05-24 NOTE — Assessment & Plan Note (Signed)
No mass, heme negative stool 

## 2014-05-24 NOTE — Assessment & Plan Note (Signed)
Pt with 9 pound weigtht loss which may well be attributed to her disease Increase aricept to standard treating dose since tolerating well No behavioral disturbance reported, and pt remains pleasant and seems content Spouse is extremely supportive and cares for her and her son is also involved

## 2014-05-24 NOTE — Assessment & Plan Note (Signed)
Vaccine administered at visit.  

## 2014-05-24 NOTE — Assessment & Plan Note (Signed)
Improved though LDL still above goal No med change Hyperlipidemia:Low fat diet discussed and encouraged.

## 2014-06-16 ENCOUNTER — Telehealth: Payer: Self-pay | Admitting: *Deleted

## 2014-06-16 NOTE — Telephone Encounter (Signed)
Pt's husband called requesting the nurse to call him back, pt [facility] a [facility] from [facility] and patient does not understand it, and per husband pt has had a reaction to mephazil. Please advise

## 2014-06-18 NOTE — Telephone Encounter (Signed)
Spoke with husband and he is aware that [facility] letter was an [employer] development officer[employer].    He also wants MD to be aware that patient stopped Aricept. States that it was causing abdominal problems and insomnia.

## 2014-06-18 NOTE — Telephone Encounter (Signed)
Noted, Ok , her dementia is very severe, will discuss possibility of patch at next visit, maybe

## 2014-07-02 ENCOUNTER — Other Ambulatory Visit: Payer: Self-pay | Admitting: Family Medicine

## 2014-09-02 ENCOUNTER — Ambulatory Visit (INDEPENDENT_AMBULATORY_CARE_PROVIDER_SITE_OTHER): Payer: Medicare Other | Admitting: Family Medicine

## 2014-09-02 ENCOUNTER — Encounter: Payer: Self-pay | Admitting: Family Medicine

## 2014-09-02 VITALS — BP 120/72 | HR 78 | Resp 16 | Ht 67.0 in | Wt 142.0 lb

## 2014-09-02 DIAGNOSIS — R7301 Impaired fasting glucose: Secondary | ICD-10-CM

## 2014-09-02 DIAGNOSIS — G309 Alzheimer's disease, unspecified: Secondary | ICD-10-CM

## 2014-09-02 DIAGNOSIS — F0280 Dementia in other diseases classified elsewhere without behavioral disturbance: Secondary | ICD-10-CM

## 2014-09-02 DIAGNOSIS — E559 Vitamin D deficiency, unspecified: Secondary | ICD-10-CM

## 2014-09-02 DIAGNOSIS — I10 Essential (primary) hypertension: Secondary | ICD-10-CM | POA: Diagnosis not present

## 2014-09-02 DIAGNOSIS — E785 Hyperlipidemia, unspecified: Secondary | ICD-10-CM | POA: Diagnosis not present

## 2014-09-02 DIAGNOSIS — R634 Abnormal weight loss: Secondary | ICD-10-CM | POA: Diagnosis not present

## 2014-09-02 MED ORDER — AMLODIPINE-ATORVASTATIN 10-40 MG PO TABS: 1.0000 | ORAL_TABLET | Freq: Every day | ORAL | Status: DC

## 2014-09-02 MED ORDER — METOPROLOL SUCCINATE ER 25 MG PO TB24: 25.0000 mg | ORAL_TABLET | Freq: Every day | ORAL | Status: DC

## 2014-09-02 MED ORDER — HYDROCHLOROTHIAZIDE 25 MG PO TABS: 25.0000 mg | ORAL_TABLET | Freq: Every day | ORAL | Status: DC

## 2014-09-02 MED ORDER — MEGESTROL ACETATE 20 MG PO TABS: 20.0000 mg | ORAL_TABLET | Freq: Every day | ORAL | Status: DC

## 2014-09-02 MED ORDER — POTASSIUM CHLORIDE 20 MEQ/15ML (10%) PO SOLN: ORAL | Status: DC

## 2014-09-02 NOTE — Patient Instructions (Signed)
Annual wellness in 3 month. Call if you need me before    Fasting lipid, cmp, HBa1C, TSH and Vit D in 3 month  Megace one tablet once daily to help appetite and weight gain  Pls try not to fall and work on improving appetite  Thanks for choosing [facility], we consider it a privelige to serve you.

## 2014-09-29 ENCOUNTER — Other Ambulatory Visit: Payer: Self-pay

## 2014-09-29 ENCOUNTER — Telehealth: Payer: Self-pay

## 2014-09-29 MED ORDER — CLONAZEPAM 0.5 MG PO TABS: 0.2500 mg | ORAL_TABLET | Freq: Two times a day (BID) | ORAL | Status: DC | PRN

## 2014-09-29 MED ORDER — QUETIAPINE FUMARATE 25 MG PO TABS: 25.0000 mg | ORAL_TABLET | Freq: Every day | ORAL | Status: DC

## 2014-09-29 NOTE — Telephone Encounter (Signed)
Son aware.  Meds sent to requested pharmacy.   Followup appointment made.

## 2014-09-29 NOTE — Addendum Note (Signed)
Addended by: Syliva OvermanSIMPSON, Maylen Waltermire E on: 09/29/2014 10:53 AM   Modules accepted: Orders

## 2014-09-29 NOTE — Telephone Encounter (Addendum)
In addition to the seroquel at bedtime which is std, pls send klonopin 0.25mg  as entered for as needed use, let him know, she needs a 6 week f/u scheduled also please

## 2014-09-29 NOTE — Telephone Encounter (Signed)
Spoke with son.  He states that she is receiving 24 hour care.  He would like to know if there something that she can take as needed during the day.  He does not want her to be sedated.  He understands that there will be side effects with all medications and it is a trial and error as far as what will work for her.  Please advise.

## 2014-09-29 NOTE — Telephone Encounter (Signed)
I recommend seroquel 25 mg at bedtime, pls send in if he agrees, and will need to have 24 hr superrvuision which will need to  Be by family who he states is very involved.  request urine c/s if there has been an acute change pls

## 2014-10-01 ENCOUNTER — Telehealth: Payer: Self-pay | Admitting: Family Medicine

## 2014-10-01 ENCOUNTER — Emergency Department (HOSPITAL_COMMUNITY): Payer: Medicare Other

## 2014-10-01 ENCOUNTER — Emergency Department (HOSPITAL_COMMUNITY)
Admission: EM | Admit: 2014-10-01 | Discharge: 2014-10-01 | Disposition: A | Discharge status: Home / Self Care | Payer: Medicare Other | Attending: Emergency Medicine | Admitting: Emergency Medicine

## 2014-10-01 ENCOUNTER — Encounter (HOSPITAL_COMMUNITY): Payer: Self-pay

## 2014-10-01 DIAGNOSIS — Z7982 Long term (current) use of aspirin: Secondary | ICD-10-CM | POA: Diagnosis not present

## 2014-10-01 DIAGNOSIS — M1990 Unspecified osteoarthritis, unspecified site: Secondary | ICD-10-CM | POA: Insufficient documentation

## 2014-10-01 DIAGNOSIS — I10 Essential (primary) hypertension: Secondary | ICD-10-CM | POA: Diagnosis not present

## 2014-10-01 DIAGNOSIS — Z79899 Other long term (current) drug therapy: Secondary | ICD-10-CM | POA: Insufficient documentation

## 2014-10-01 DIAGNOSIS — Z880 Allergy status to penicillin: Secondary | ICD-10-CM | POA: Insufficient documentation

## 2014-10-01 DIAGNOSIS — R5383 Other fatigue: Secondary | ICD-10-CM | POA: Diagnosis present

## 2014-10-01 DIAGNOSIS — E785 Hyperlipidemia, unspecified: Secondary | ICD-10-CM | POA: Diagnosis not present

## 2014-10-01 DIAGNOSIS — F0390 Unspecified dementia without behavioral disturbance: Secondary | ICD-10-CM | POA: Insufficient documentation

## 2014-10-01 HISTORY — DX: Unspecified dementia, unspecified severity, without behavioral disturbance, psychotic disturbance, mood disturbance, and anxiety: F03.90

## 2014-10-01 LAB — CBC WITH DIFFERENTIAL/PLATELET
BASOS ABS: 0 10*3/uL (ref 0.0–0.1)
Basophils Relative: 0 % (ref 0–1)
EOS ABS: 0 10*3/uL (ref 0.0–0.7)
EOS PCT: 0 % (ref 0–5)
HCT: 35.2 % — ABNORMAL LOW (ref 36.0–46.0)
Hemoglobin: 11.8 g/dL — ABNORMAL LOW (ref 12.0–15.0)
LYMPHS PCT: 16 % (ref 12–46)
Lymphs Abs: 1.3 10*3/uL (ref 0.7–4.0)
MCH: 27.2 pg (ref 26.0–34.0)
MCHC: 33.5 g/dL (ref 30.0–36.0)
MCV: 81.1 fL (ref 78.0–100.0)
MONOS PCT: 11 % (ref 3–12)
Monocytes Absolute: 0.9 10*3/uL (ref 0.1–1.0)
Neutro Abs: 6 10*3/uL (ref 1.7–7.7)
Neutrophils Relative %: 73 % (ref 43–77)
PLATELETS: 228 10*3/uL (ref 150–400)
RBC: 4.34 MIL/uL (ref 3.87–5.11)
RDW: 14.4 % (ref 11.5–15.5)
WBC: 8.1 10*3/uL (ref 4.0–10.5)

## 2014-10-01 LAB — COMPREHENSIVE METABOLIC PANEL
ALT: 20 U/L (ref 14–54)
ANION GAP: 13 (ref 5–15)
AST: 25 U/L (ref 15–41)
Albumin: 4.2 g/dL (ref 3.5–5.0)
Alkaline Phosphatase: 44 U/L (ref 38–126)
BUN: 18 mg/dL (ref 6–20)
CHLORIDE: 105 mmol/L (ref 101–111)
CO2: 23 mmol/L (ref 22–32)
Calcium: 8.9 mg/dL (ref 8.9–10.3)
Creatinine, Ser: 0.91 mg/dL (ref 0.44–1.00)
GFR calc Af Amer: >60 mL/min (ref >60)
GFR calc non Af Amer: 58 mL/min — ABNORMAL LOW (ref >60)
Glucose, Bld: 110 mg/dL — ABNORMAL HIGH (ref 65–99)
Potassium: 3 mmol/L — ABNORMAL LOW (ref 3.5–5.1)
SODIUM: 141 mmol/L (ref 135–145)
Total Bilirubin: 1.6 mg/dL — ABNORMAL HIGH (ref 0.3–1.2)
Total Protein: 6.9 g/dL (ref 6.5–8.1)

## 2014-10-01 LAB — URINALYSIS, ROUTINE W REFLEX MICROSCOPIC
Glucose, UA: NEGATIVE mg/dL
Ketones, ur: 15 mg/dL — AB
Leukocytes, UA: NEGATIVE
Nitrite: NEGATIVE
SPECIFIC GRAVITY, URINE: 1.025 (ref 1.005–1.030)
Urobilinogen, UA: 1 mg/dL (ref 0.0–1.0)
pH: 5.5 (ref 5.0–8.0)

## 2014-10-01 LAB — URINE MICROSCOPIC-ADD ON

## 2014-10-01 LAB — TROPONIN I: Troponin I: <0.03 ng/mL (ref <0.031)

## 2014-10-01 NOTE — ED Notes (Signed)
In and out cath. VP assisting with insertion

## 2014-10-01 NOTE — Clinical Social Work Note (Signed)
CSW met with pt's husband in ED. He reports that Dr. Moshe Cipro sent them to ED for evaluation for possible admission. CSW explained that per EDP, pt will likely d/c from ED. Husband states pt was diagnosed with dementia a few years ago. She has been more lethargic the last few months and recently has been incontinent. Pt occasionally wanders, but husband said that her son put a lock on the door that pt cannot unlock. Pt ambulates independently. CSW discussed possibility of placement as noted in Dr. Griffin Dakin note. Husband had no idea that this was to be discussed. He has no intentions of placing pt in facility at this point and wants to take pt home. He refused to even accept facility lists. CSW notified pt's RN of discussion.   Benay Pike, [place]

## 2014-10-01 NOTE — ED Notes (Signed)
Son reports pt has dementia and has been more lethargic for the past 2 months.  Reports has recently became incontinent of bowels and bladder, starting to talk to people that have passed on, and "wandering" at home.  Son says has noticed slurred speech for the past 2 days.  Pt alert, pleasant.   Denies pain.

## 2014-10-01 NOTE — Telephone Encounter (Signed)
Son in today concerned Mom is deteriorating , has had increased agitation, wandering, poor intake, incontinence of stool and urine, conversations with deceased people, fidgeting behavior, in short major deterioration, , increased fall risk , unsteady balance. Explained needs hospirtal eval and may likely need short if not long term care  I will directly call the ED

## 2014-10-01 NOTE — Telephone Encounter (Signed)
I spoke directly with the couple in the office, and this is documented, I also spoke with the ED Doc, the hope is that a medical reason for admission will be found, if not the Ed may try to look at palcement, from the ED, I attempted to directly convey this to the son and left a message

## 2014-10-01 NOTE — Discharge Instructions (Signed)

## 2014-10-01 NOTE — ED Notes (Addendum)
Per husband, pt suffers from dementia. Pt is normally unable to carry a conversation. Husband states, "She speaks in her own language." At present pt mumbles when questions are asked, which is nothing new. Hands cleaned of stool on arrival to ED. Pt is currently resting with eyes closed. Husband states increased slurring of speech seemed to have began after PCP increased two of her medications, one being an anxiety medication, possiblly, per the husband.

## 2014-10-01 NOTE — ED Provider Notes (Signed)
CSN: [id number]     Arrival date & time 10/01/14  1148 History  This chart was scribed for No att. providers found by Elveria Rising, ED scribe.  This patient was seen in room APA07/APA07 and the patient's care was started at 12:24 PM.   Chief Complaint  Patient presents with  . Aphasia  . Fatigue   The history is provided by the spouse and a relative. No language interpreter was used.   Level 5 Caveat: Dementia  HPI Comments: Angela Cordova is a 81 y.o. female with PMHx of dementia who presents to the Emergency Department complaining of worsening dementia and fatigue over the past two months. Per nurse's note son reports that patient has recently become incontinent of bowels and bladder, is talking to people who are deceased, wandering around the home and slurred speech. Husband shares history of aphasia stating patient "speaks in her own language." Husband associated increased slurred speech to changes in patient's medications. Husband reports frequent falls at home and general uncooperativeness when it comes to assisting her with daily activities.    Past Medical History  Diagnosis Date  . Hypertension   . Hyperlipidemia   . Arthritis   . Dementia    Past Surgical History  Procedure Laterality Date  . Abdominal hysterectomy     No family history on file. History  Substance Use Topics  . Smoking status: Never Smoker   . Smokeless tobacco: Not on file  . Alcohol Use: No   OB History    No data available     Review of Systems  Unable to perform ROS: Dementia      Allergies  Aricept and Penicillins  Home Medications   Prior to Admission medications   Medication Sig Start Date End Date Taking? Authorizing Provider  amLODipine-atorvastatin (CADUET) 10-40 MG per tablet Take 1 tablet by mouth daily. 09/02/14  Yes Kerri Perches, MD  aspirin (ECOTRIN LOW STRENGTH) 81 MG EC tablet Take 81 mg by mouth daily.    Yes Historical Provider, MD  Calcium Carbonate-Vitamin D (TGT  CALCIUM DIETARY SUPPLEMENT PO) Take 1 tablet by mouth daily.    Yes Historical Provider, MD  CINNAMON PO Take 1,000 mg by mouth daily.    Yes Historical Provider, MD  clonazePAM (KLONOPIN) 0.5 MG tablet Take 0.5 tablets (0.25 mg total) by mouth 2 (two) times daily as needed for anxiety. 09/29/14  Yes Kerri Perches, MD  co-enzyme Q-10 50 MG capsule Take 50 mg by mouth daily.   Yes Historical Provider, MD  hydrochlorothiazide (HYDRODIURIL) 25 MG tablet Take 1 tablet (25 mg total) by mouth daily. 09/02/14  Yes Kerri Perches, MD  megestrol (MEGACE) 20 MG tablet Take 1 tablet (20 mg total) by mouth daily. 09/02/14  Yes Kerri Perches, MD  metoprolol succinate (TOPROL-XL) 25 MG 24 hr tablet Take 1 tablet (25 mg total) by mouth daily. 09/02/14  Yes Kerri Perches, MD  Multiple Vitamins-Minerals (CLINICAL NUTRIENTS 45-PLUS WMN) TABS Take 1 tablet by mouth daily.    Yes Historical Provider, MD  Omega 3-6-9 Fatty Acids (OMEGA-3 & OMEGA-6 FISH OIL PO) Take 1 capsule by mouth daily.    Yes Historical Provider, MD  potassium chloride 20 MEQ/15ML (10%) solution DILUTE 7.5 ML BY MOUTH IN A FULL GLASS OF WATER AND DRINK ONCE DAILY. Patient taking differently: DILUTE 5 ML BY MOUTH IN A FULL GLASS OF WATER AND DRINK ONCE DAILY. 11/26/13  Yes Kerri Perches, MD  QUEtiapine (SEROQUEL)  25 MG tablet Take 1 tablet (25 mg total) by mouth at bedtime. 09/29/14  Yes Kerri PerchesMargaret E Simpson, MD  potassium chloride 20 MEQ/15ML (10%) SOLN Dilute 7.5 ml in a  full glass of water daily Patient not taking: Reported on 10/01/2014 09/02/14   Kerri PerchesMargaret E Simpson, MD   Triage Vitals: BP 131/64 mmHg  Pulse 82  Temp(Src) 98.4 F (36.9 C) (Oral)  Resp 16  SpO2 100% Physical Exam  Constitutional: She is oriented to person, place, and time. She appears well-developed and well-nourished. No distress.  HENT:  Head: Normocephalic and atraumatic.  Eyes: EOM are normal. Pupils are equal, round, and reactive to light.  Neck: Neck  supple. No tracheal deviation present.  Cardiovascular: Normal rate.   Pulmonary/Chest: Effort normal and breath sounds normal. No respiratory distress. She has no wheezes. She has no rales.  Abdominal: Soft. There is no tenderness.  Musculoskeletal: Normal range of motion.  Moving all extremities.   Neurological: She is alert and oriented to person, place, and time.  Skin: Skin is warm and dry.  Psychiatric: She has a normal mood and affect. Her behavior is normal.  Nursing note and vitals reviewed.   ED Course  Procedures (including critical care time)  COORDINATION OF CARE: 12:34 PM- Discussed treatment plan with patient's husband at bedside and he agreed to plan.   Labs Review Labs Reviewed  COMPREHENSIVE METABOLIC PANEL - Abnormal; Notable for the following:    Potassium 3.0 (*)    Glucose, Bld 110 (*)    Total Bilirubin 1.6 (*)    GFR calc non Af Amer 58 (*)    All other components within normal limits  URINALYSIS, ROUTINE W REFLEX MICROSCOPIC (NOT AT [facility]) - Abnormal; Notable for the following:    Hgb urine dipstick SMALL (*)    Bilirubin Urine SMALL (*)    Ketones, ur 15 (*)    Protein, ur TRACE (*)    All other components within normal limits  CBC WITH DIFFERENTIAL/PLATELET - Abnormal; Notable for the following:    Hemoglobin 11.8 (*)    HCT 35.2 (*)    All other components within normal limits  URINE MICROSCOPIC-ADD ON - Abnormal; Notable for the following:    Squamous Epithelial / LPF FEW (*)    All other components within normal limits  TROPONIN I    Imaging Review Ct Head Wo Contrast  10/01/2014   CLINICAL DATA:  Confusion, slurred speech.  EXAM: CT HEAD WITHOUT CONTRAST  TECHNIQUE: Contiguous axial images were obtained from the base of the skull through the vertex without intravenous contrast.  COMPARISON:  None.  FINDINGS: Bony calvarium appears intact. Minimal diffuse cortical atrophy is noted. No mass effect or midline shift is noted. Ventricular size is  within normal limits. There is no evidence of mass lesion, hemorrhage or acute infarction.  IMPRESSION: Minimal diffuse cortical atrophy. No acute intracranial abnormality seen.   Electronically Signed   By: Lupita RaiderJames  Green Jr, M.D.   On: 10/01/2014 14:23   Dg Chest Portable 1 View  10/01/2014   CLINICAL DATA:  Dementia and increased fatigue past 2 months  EXAM: PORTABLE CHEST - 1 VIEW  COMPARISON:  June 25, 2003  FINDINGS: There is no edema or consolidation. Heart size and pulmonary vascularity are within normal limits. No adenopathy. No bone lesions.  IMPRESSION: No edema or consolidation.   Electronically Signed   By: Bretta BangWilliam  Woodruff III M.D.   On: 10/01/2014 13:35     EKG Interpretation  Date/Time:  Thursday October 01 2014 12:06:05 EDT Ventricular Rate:  80 PR Interval:  165 QRS Duration: 79 QT Interval:  387 QTC Calculation: 446 R Axis:   -28 Text Interpretation:  Sinus rhythm Borderline left axis deviation Low  voltage, precordial leads Borderline T abnormalities, anterior leads  Baseline wander in lead(s) II aVR aVF Confirmed by Rubin Payor  MD, Harrold Donath  [phone]) on 10/02/2014 7:02:43 AM      MDM   Final diagnoses:  Dementia, without behavioral disturbance    Patient sent in for mental status changes. Has history of dementia. Possible slight dehydration but no other severe cause. Unable to find reason for admission. I have discussed with the patient's PCP before arrival who stated that she will probably need placement. Social work saw the patient in ER, however patient's family did not want placement and patient was discharged home.  I personally performed the services described in this documentation, which was scribed in my presence. The recorded information has been reviewed and is accurate.     Benjiman Core, MD 10/02/14 [phone]

## 2014-10-01 NOTE — Telephone Encounter (Signed)
Son and wife into office.  States that patient Armed forces operational officer(Demetrice Hoglund) took 1 dose of Klonopin, and 1 dose of Seroquel.  She has since had uncontrollable bowels.  She has been unable to sleep due to this and she is also very fatigued.  Please advise.

## 2014-10-30 ENCOUNTER — Telehealth: Payer: Self-pay | Admitting: Family Medicine

## 2014-11-04 NOTE — Telephone Encounter (Signed)
Left a message forr son on tele he called in on to call back,  I then called her home and spoke directly with pt and her spouse , she spoke very appropriately, said she was doing better I advised her to keep her 7/11 appt

## 2014-11-09 ENCOUNTER — Ambulatory Visit: Payer: Medicare Other | Admitting: Family Medicine

## 2014-11-22 NOTE — Assessment & Plan Note (Signed)
Noted to be pocketing food, as a part of her disease, start low dose megace as appetite stimulant

## 2014-11-22 NOTE — Progress Notes (Signed)
Angela Cordova     MRN: [id number]      DOB: [birth year]   HPI Angela Cordova is here for follow up and re-evaluation of chronic medical conditions, medication management and review of any available recent lab and radiology data.  Preventive health is updated, specifically  Cancer screening and Immunization.   Family reports  adverse reactions to aricept/ intolerance, so they have again stopped the medication. State that as far as her dementia she is doing well, her behavior is controlled and stable.They wish to keep her off of any medication at this time for her disease. Son does say he has to go over and "be stern" with his Mom at times to prevent wandering, encourage her to eat etc. Adamant that she not be placed , even short term in a facility. She continues to have a poor appetite , pockets food at times, and family is interested in medication to help her appetite   ROS  History provided by son and spouse, patient is incapable Denies recent fever or chills. Denies  nasal congestion,  Denies chest congestion, productive cough or wheezing. Denies leg swelling Denies  vomiting,diarrhea or constipation.   Denies frequency, or malodorous urine  Denies skin break down or rash.   PE  BP 120/72 mmHg  Pulse 78  Resp 16  Ht  (1.702 m)  Wt 142 lb (64.411 kg)  BMI 22.24 kg/m2  SpO2 99%  Patient alert disoriented and in no cardiopulmonary distress.  HEENT: No facial asymmetry, EOMI,   oropharynx pink and moist.  Neck supple no JVD, no mass.  Chest: Clear to auscultation bilaterally.  CVS: S1, S2 no murmurs, no S3.Regular rate.  ABD: Soft non tender.   Ext: No edema  MS: Adequate ROM spine, shoulders, hips and reduced in knees.  Skin: Intact, no ulcerations or rash noted.  Psych: Good eye contact,flat affect. Memory loss not anxious or depressed appearing.  CNS: CN 2-12 intact, power,  normal throughout.no focal deficits noted.   Assessment & Plan   Alzheimer's  disease Family reports intolerance to aricept, reports that she is "dong well" denies  Uncontrolled behavior, will continue same Pt does have severe disease and has had repeated episodes when her son had to go over to control her behavior and has had episodes of attempted wandering , will follow along with family  Weight loss, unintentional Noted to be pocketing food, as a part of her disease, start low dose megace as appetite stimulant  Essential hypertension Controlled, no change in medication   IMPAIRED FASTING GLUCOSE   Updated lab needed at/ before next visit.   Diabetic Labs Latest Ref Rng 10/01/2014 05/08/2014 11/21/2013 03/07/2013 09/11/2012  HbA1c <5.7 % - - 6.0(H) 6.1(H) -  Chol 0 - 200 mg/dL - 409 811 914 782(N)  HDL >39 mg/dL - 68 51 51 46  Calc LDL 0 - 99 mg/dL - 562(Z) 85 308(M) 578(I)  Triglycerides <150 mg/dL - 54 94 97 696  Creatinine 0.44 - 1.00 mg/dL 2.95 2.84 1.32 4.40 1.02   BP/Weight 10/01/2014 09/02/2014 05/04/2014 12/25/2013 09/02/2013 05/13/2013 01/15/2013  Systolic BP 124 120 124 140 128 128 120  Diastolic BP 78 72 74 64 74 70 64  Wt. (Lbs) - 142 148 157.04 172.08 171.12 170  BMI - 22.24 23.17 24.97 27.36 27.21 27.03   No flowsheet data found.     Hyperlipemia Hyperlipidemia:Low fat diet discussed and encouraged.   Lipid Panel  Lab Results  Component Value Date  CHOL 187 05/08/2014   HDL 68 05/08/2014   LDLCALC 108* 05/08/2014   TRIG 54 05/08/2014   CHOLHDL 2.8 05/08/2014  Updated lab needed at/ before next visit.       OSTEOARTHRITIS, KNEE No report of any falls Home safety reviewed with her spouse and son who accompany her

## 2014-11-22 NOTE — Assessment & Plan Note (Signed)
  Updated lab needed at/ before next visit.   Diabetic Labs Latest Ref Rng 10/01/2014 05/08/2014 11/21/2013 03/07/2013 09/11/2012  HbA1c <5.7 % - - 6.0(H) 6.1(H) -  Chol 0 - 200 mg/dL - 098 119 147 829(F)  HDL >39 mg/dL - 68 51 51 46  Calc LDL 0 - 99 mg/dL - 621(H) 85 086(V) 784(O)  Triglycerides <150 mg/dL - 54 94 97 962  Creatinine 0.44 - 1.00 mg/dL 9.52 8.41 3.24 4.01 0.27   BP/Weight 10/01/2014 09/02/2014 05/04/2014 12/25/2013 09/02/2013 05/13/2013 01/15/2013  Systolic BP 124 120 124 140 128 128 120  Diastolic BP 78 72 74 64 74 70 64  Wt. (Lbs) - 142 148 157.04 172.08 171.12 170  BMI - 22.24 23.17 24.97 27.36 27.21 27.03   No flowsheet data found.

## 2014-11-22 NOTE — Assessment & Plan Note (Signed)
Family reports intolerance to aricept, reports that she is "dong well" denies  Uncontrolled behavior, will continue same Pt does have severe disease and has had repeated episodes when her son had to go over to control her behavior and has had episodes of attempted wandering , will follow along with family

## 2014-11-22 NOTE — Assessment & Plan Note (Signed)
Controlled, no change in medication  

## 2014-11-22 NOTE — Assessment & Plan Note (Signed)
No report of any falls Home safety reviewed with her spouse and son who accompany her

## 2014-11-22 NOTE — Assessment & Plan Note (Signed)
Hyperlipidemia:Low fat diet discussed and encouraged.   Lipid Panel  Lab Results  Component Value Date   CHOL 187 05/08/2014   HDL 68 05/08/2014   LDLCALC 108* 05/08/2014   TRIG 54 05/08/2014   CHOLHDL 2.8 05/08/2014  Updated lab needed at/ before next visit.

## 2014-11-25 ENCOUNTER — Other Ambulatory Visit: Payer: Self-pay

## 2014-11-25 ENCOUNTER — Telehealth: Payer: Self-pay | Admitting: Family Medicine

## 2014-11-25 MED ORDER — TEMAZEPAM 15 MG PO CAPS: 15.0000 mg | ORAL_CAPSULE | Freq: Every evening | ORAL | Status: DC | PRN

## 2014-11-25 NOTE — Telephone Encounter (Signed)
I called son to see how pt is douiing with behavioral issues as she had missed last appt ,siad better but requested sleep aid, will send restoril 15 mg I made him aaware of geriatric psych being THE BEST option for management of her dementia with behavioral disturbance, does not feel the need now and will call back if he does  On checking her med list seroquel had been started in the past , if she is taking it, which I doubt, she needs to stop this and start restoril, you will need to discusss this with ehr son Fayrene Fearing who I had called and also notify there pharmacy PLS

## 2014-11-25 NOTE — Telephone Encounter (Signed)
Spoke with son and he states that patient is no longer taking Seroquel.  Med removed from med list.   Restoril sent to pharmacy.

## 2015-02-16 ENCOUNTER — Ambulatory Visit (INDEPENDENT_AMBULATORY_CARE_PROVIDER_SITE_OTHER): Payer: Medicare Other | Admitting: Family Medicine

## 2015-02-16 ENCOUNTER — Encounter: Payer: Self-pay | Admitting: Family Medicine

## 2015-02-16 VITALS — BP 114/72 | HR 82 | Resp 18 | Ht 67.0 in | Wt 117.0 lb

## 2015-02-16 DIAGNOSIS — G301 Alzheimer's disease with late onset: Secondary | ICD-10-CM

## 2015-02-16 DIAGNOSIS — Z0000 Encounter for general adult medical examination without abnormal findings: Secondary | ICD-10-CM

## 2015-02-16 DIAGNOSIS — F0281 Dementia in other diseases classified elsewhere with behavioral disturbance: Secondary | ICD-10-CM

## 2015-02-16 NOTE — Patient Instructions (Signed)
F/u in 5 month, call if you need me sooner  No changes in management, continue to be blessed with the love of family

## 2015-02-16 NOTE — Progress Notes (Signed)
Subjective:    Patient ID: Angela Cordova, female    DOB: 1931-12-10, 81 y.o.   MRN: [id number]  HPI Preventive Screening-Counseling & Management   Patient present here today for a Medicare annual wellness visit. Her health has deteriorated significantly in the past 12 months , and she now has end stage Alzheimer's disease. As she weakens physically, behavioral concerns of agitation, combative behavior and wandering are less, and all prescription medication has been voluntarily discontinued by her family, who are adamant that they care for her at home and who are adjusting to her condition and doing the best that they can with love and respect for Angela Cordova    Current Problems (verified) t noted to be crying, but has become increasingly withdrawn,   Medications Prior to Visit Allergies (verified)   PAST HISTORY  Family History (updated)  Social History Married female mother of 1 with end stage dementia    Risk Factors  Current exercise habits: Limited due to cognitive problems (end stage dementia)  Dietary issues discussed:25 pound weight loss in past 5 months, reduced oral intake , though family is satisfied   Cardiac risk factors: none significant  Depression Screen : unable to assess due to illness (Note: if answer to either of the following is "Yes", a more complete depression screening is indicated) Unable to access due to cognition   Over the past two weeks, have you felt down, depressed or hopeless?   Over the past two weeks, have you felt little interest or pleasure in doing things?  Have you lost interest or pleasure in daily life?  Do you often feel hopeless? N/A  Do you cry easily over simple problems? No   Activities of Daily Living  In your present state of health, do you have any difficulty performing the following activities? All are Limited due to end stage dementia; care handled by family entirely, currently incapable  Driving?: Yes Managing money?:  Yes Feeding yourself?: Yes Getting from bed to chair?: Yes Climbing a flight of stairs?: Yes Preparing food and eating?: Yes Bathing or showering?:  Yes Getting dressed?: Yes Getting to the toilet?: Yes Using the toilet?: Yes Moving around from place to place?: Yes  Fall Risk Assessment In the past year have you fallen or had a near fall?:No Are you currently taking any medications that make you dizzy?:No, medications have been discontinued by her family, which is appropriate based on the severity of her illness   Hearing Difficulties: No Do you often ask people to speak up or repeat themselves?:Not applicable, increasingly non verbal, and speech  is inappropriate and rambling when she does speak Do you experience ringing or noises in your ears?:unable to assess Do you have difficulty understanding soft or whispered voices?:unable to assess  Cognitive Testing  Alert? No Normal Appearance?no, though appropriately dressed for the visit, blank face , disengaged Oriented to person? no Place? No  Time? No Displays appropriate judgment? No Can read the correct time from a watch face? No Are you having problems remembering things? Yes, end stage dementia care handled by family.    Advanced Directives have been discussed with the patient?Yes and family has made decisions regarding care and wish to care for her at home , they have independently decided to discontinue prescription medications and refuse the flu vaccine today. Her poor prognosis and terminal condition because of end stage dementia appear to be accepted by her family at this time   List the Names of Other  Physician/Practitioners you currently use: no other careteam providers    Indicate any recent Medical Services you may have received from other than [facility] providers in the past year (date may be approximate).   Assessment:    Annual Wellness Exam   Plan:    Patient Instructions (the written plan) was given to the  patient.  Medicare Attestation  I have personally reviewed:  The patient's medical and social history  Their use of alcohol, tobacco or illicit drugs  Their current medications and supplements  The patient's functional ability including ADLs,fall risks, home safety risks, cognitive, and hearing and visual impairment  Diet and physical activities  Evidence for depression or mood disorders  The patient's weight, height, BMI, and visual acuity have been recorded in the chart. I have made referrals, counseling, and provided education to the patient based on review of the above and I have provided the patient with a written personalized care plan for preventive services.      Review of Systems     Objective:   Physical Exam BP 114/72 mmHg  Pulse 82  Resp 18  Ht 5\' 7"  (1.702 m)  Wt 117 lb (53.071 kg)  BMI 18.32 kg/m2  SpO2 99%    Pt agitated, not combative, and anxious to leave, would not sit for visit. In the  past she has been pleasant and attempting to engage positivley, deterioration due to end stage alzheimer's Marked weight loss and currently malnourished, she has lost 25 pounds in the past 5 months  Incapable of providing history, she is accompanied by her son and her spouse who are her primary caregivers, has expressive aphasia.       Assessment & Plan:  Medicare annual wellness visit, subsequent Annual wellness visit as documented Pt has end stage dementia. She is being cared for in her home by her son, his wife and her elderly spouse. Family has independently decided to discontinue all prescription medication and essentially providing comfort care. [facility] members, who are her  son and her  spouse  Alzheimer's disease Currently end stage, supportive care in home by family, on no prescription medication Incapable of all ADL's

## 2015-02-27 ENCOUNTER — Encounter: Payer: Self-pay | Admitting: Family Medicine

## 2015-02-27 DIAGNOSIS — Z0000 Encounter for general adult medical examination without abnormal findings: Secondary | ICD-10-CM | POA: Insufficient documentation

## 2015-02-27 NOTE — Assessment & Plan Note (Signed)
Currently end stage, supportive care in home by family, on no prescription medication Incapable of all ADL's

## 2015-02-27 NOTE — Assessment & Plan Note (Signed)
Annual wellness visit as documented Pt has end stage dementia. She is being cared for in her home by her son, his wife and her elderly spouse. Family has independently decided to discontinue all prescription medication and essentially providing comfort care. [facility] members, who are her  son and her  spouse

## 2015-07-19 ENCOUNTER — Ambulatory Visit: Payer: Medicare Other | Admitting: Family Medicine

## 2015-07-20 ENCOUNTER — Encounter: Payer: Self-pay | Admitting: Family Medicine

## 2015-07-20 ENCOUNTER — Telehealth: Payer: Self-pay | Admitting: Family Medicine

## 2015-07-20 NOTE — Telephone Encounter (Signed)
Direct contact made with son as pt missed appt and she has severe dementia  Remains on no medications, states her eating is fair, no falls and the family is caring for her well

## 2015-08-24 ENCOUNTER — Telehealth: Payer: Self-pay

## 2015-08-24 NOTE — Telephone Encounter (Signed)
Family says that patient's husband recently had stroke and is in the hospital (he was her primary caregiver) this has made her symptoms worse.  Recommended that family try Melatonin otc until PCP returns to give further recommendations.  Family appreciative.

## 2015-08-30 MED ORDER — MIRTAZAPINE 7.5 MG PO TABS: 7.5000 mg | ORAL_TABLET | Freq: Every day | ORAL | Status: DC

## 2015-08-30 NOTE — Telephone Encounter (Addendum)
Script written for remeron, pls send and let family know, also pls convey my concern re turn of events, will try to get any help that they request if able, psl let them know

## 2015-08-30 NOTE — Addendum Note (Signed)
Addended by: Syliva OvermanSIMPSON, MARGARET E on: 08/30/2015 01:26 PM   Modules accepted: Orders

## 2015-08-31 ENCOUNTER — Other Ambulatory Visit: Payer: Self-pay

## 2015-08-31 NOTE — Telephone Encounter (Signed)
Spoke with son.  He is thankful for concern.  He states that patient is declining and is now wheelchair bound.  He wanted to discuss the need for a hospital bed.  I went through the process with him and advised him that this would require a face-to-face visit.  He agreed and will call back for this when able.

## 2015-09-10 ENCOUNTER — Telehealth: Payer: Self-pay

## 2015-09-10 NOTE — Telephone Encounter (Addendum)
Attempted to contact son at number  , stated that there was no need to take her to the ED,she returned to baseline spontaneously she states now that she is "ready to go home," is really a comfort care/ palliative care , I explained that I understood his point of view, and that I am very aware that both he and his wife were doing "everything that they could" with love, for his Mom.He states that she requires assistance with everything now and is immobile, she will not attend the funeral of her recently deceased spouse, she is physically unable and is mentally unable to comprehend what is going on, I agree. He stated that the remron dose was of no benefit in terms of her restign, I advised him to double the dose and give her two tablets (15 mg ) instead of one  Tried to call back to offer hospice support , had to leave a message for him to call back and spk with nurse Toni Amendourtney who I have also discussed this directly with. I advised no pressure on son, as she told  Me that earlier this week she had again offered support to the family and he had declined at that time

## 2015-09-10 NOTE — Telephone Encounter (Signed)
Noted.  Will speak with son if he calls office.

## 2015-09-12 ENCOUNTER — Emergency Department (HOSPITAL_COMMUNITY): Payer: Medicare Other

## 2015-09-12 ENCOUNTER — Inpatient Hospital Stay (HOSPITAL_COMMUNITY)
Admission: EM | Admit: 2015-09-12 | Discharge: 2015-09-15 | DRG: 057 | Disposition: A | Discharge status: Hospice | Payer: Medicare Other | Attending: Internal Medicine | Admitting: Internal Medicine

## 2015-09-12 ENCOUNTER — Encounter (HOSPITAL_COMMUNITY): Payer: Self-pay | Admitting: Emergency Medicine

## 2015-09-12 DIAGNOSIS — Z79899 Other long term (current) drug therapy: Secondary | ICD-10-CM

## 2015-09-12 DIAGNOSIS — E785 Hyperlipidemia, unspecified: Secondary | ICD-10-CM | POA: Diagnosis present

## 2015-09-12 DIAGNOSIS — R627 Adult failure to thrive: Secondary | ICD-10-CM | POA: Diagnosis present

## 2015-09-12 DIAGNOSIS — L8990 Pressure ulcer of unspecified site, unspecified stage: Secondary | ICD-10-CM | POA: Diagnosis not present

## 2015-09-12 DIAGNOSIS — M1990 Unspecified osteoarthritis, unspecified site: Secondary | ICD-10-CM | POA: Diagnosis present

## 2015-09-12 DIAGNOSIS — Z7401 Bed confinement status: Secondary | ICD-10-CM

## 2015-09-12 DIAGNOSIS — R29898 Other symptoms and signs involving the musculoskeletal system: Secondary | ICD-10-CM | POA: Diagnosis present

## 2015-09-12 DIAGNOSIS — G4700 Insomnia, unspecified: Secondary | ICD-10-CM | POA: Diagnosis present

## 2015-09-12 DIAGNOSIS — R451 Restlessness and agitation: Secondary | ICD-10-CM | POA: Diagnosis present

## 2015-09-12 DIAGNOSIS — G309 Alzheimer's disease, unspecified: Secondary | ICD-10-CM | POA: Diagnosis present

## 2015-09-12 DIAGNOSIS — Z888 Allergy status to other drugs, medicaments and biological substances status: Secondary | ICD-10-CM | POA: Diagnosis not present

## 2015-09-12 DIAGNOSIS — Z515 Encounter for palliative care: Secondary | ICD-10-CM | POA: Diagnosis present

## 2015-09-12 DIAGNOSIS — I10 Essential (primary) hypertension: Secondary | ICD-10-CM | POA: Diagnosis present

## 2015-09-12 DIAGNOSIS — Z66 Do not resuscitate: Secondary | ICD-10-CM | POA: Diagnosis present

## 2015-09-12 DIAGNOSIS — F0280 Dementia in other diseases classified elsewhere without behavioral disturbance: Secondary | ICD-10-CM | POA: Diagnosis present

## 2015-09-12 DIAGNOSIS — Z7189 Other specified counseling: Secondary | ICD-10-CM | POA: Insufficient documentation

## 2015-09-12 DIAGNOSIS — R569 Unspecified convulsions: Secondary | ICD-10-CM | POA: Diagnosis present

## 2015-09-12 DIAGNOSIS — R450 Nervousness: Secondary | ICD-10-CM

## 2015-09-12 DIAGNOSIS — Z681 Body mass index (BMI) 19 or less, adult: Secondary | ICD-10-CM

## 2015-09-12 DIAGNOSIS — G2571 Drug induced akathisia: Secondary | ICD-10-CM | POA: Diagnosis not present

## 2015-09-12 DIAGNOSIS — R634 Abnormal weight loss: Secondary | ICD-10-CM | POA: Diagnosis present

## 2015-09-12 DIAGNOSIS — Z880 Allergy status to penicillin: Secondary | ICD-10-CM

## 2015-09-12 DIAGNOSIS — F0390 Unspecified dementia without behavioral disturbance: Secondary | ICD-10-CM | POA: Insufficient documentation

## 2015-09-12 DIAGNOSIS — F0391 Unspecified dementia with behavioral disturbance: Secondary | ICD-10-CM | POA: Diagnosis not present

## 2015-09-12 DIAGNOSIS — T43025A Adverse effect of tetracyclic antidepressants, initial encounter: Secondary | ICD-10-CM | POA: Diagnosis present

## 2015-09-12 HISTORY — DX: Dementia in other diseases classified elsewhere, unspecified severity, without behavioral disturbance, psychotic disturbance, mood disturbance, and anxiety: F02.80

## 2015-09-12 HISTORY — DX: Alzheimer's disease, unspecified: G30.9

## 2015-09-12 HISTORY — DX: Insomnia, unspecified: G47.00

## 2015-09-12 LAB — COMPREHENSIVE METABOLIC PANEL
ALBUMIN: 4.1 g/dL (ref 3.5–5.0)
ALT: 21 U/L (ref 14–54)
AST: 20 U/L (ref 15–41)
Alkaline Phosphatase: 68 U/L (ref 38–126)
Anion gap: 5 (ref 5–15)
BUN: 19 mg/dL (ref 6–20)
CO2: 28 mmol/L (ref 22–32)
Calcium: 8.9 mg/dL (ref 8.9–10.3)
Chloride: 107 mmol/L (ref 101–111)
Creatinine, Ser: 0.88 mg/dL (ref 0.44–1.00)
GFR calc Af Amer: >60 mL/min (ref >60)
GFR calc non Af Amer: 60 mL/min — ABNORMAL LOW (ref >60)
GLUCOSE: 86 mg/dL (ref 65–99)
POTASSIUM: 3.7 mmol/L (ref 3.5–5.1)
Sodium: 140 mmol/L (ref 135–145)
Total Bilirubin: 0.8 mg/dL (ref 0.3–1.2)
Total Protein: 7.1 g/dL (ref 6.5–8.1)

## 2015-09-12 LAB — URINALYSIS, ROUTINE W REFLEX MICROSCOPIC
Bilirubin Urine: NEGATIVE
Glucose, UA: NEGATIVE mg/dL
Ketones, ur: NEGATIVE mg/dL
LEUKOCYTES UA: NEGATIVE
Nitrite: NEGATIVE
Protein, ur: NEGATIVE mg/dL
SPECIFIC GRAVITY, URINE: 1.015 (ref 1.005–1.030)
pH: 7 (ref 5.0–8.0)

## 2015-09-12 LAB — CBC WITH DIFFERENTIAL/PLATELET
BASOS ABS: 0 10*3/uL (ref 0.0–0.1)
BASOS PCT: 0 %
EOS PCT: 1 %
Eosinophils Absolute: 0 10*3/uL (ref 0.0–0.7)
HCT: 43.5 % (ref 36.0–46.0)
Hemoglobin: 14.2 g/dL (ref 12.0–15.0)
Lymphocytes Relative: 32 %
Lymphs Abs: 1.4 10*3/uL (ref 0.7–4.0)
MCH: 27.8 pg (ref 26.0–34.0)
MCHC: 32.6 g/dL (ref 30.0–36.0)
MCV: 85.3 fL (ref 78.0–100.0)
MONO ABS: 0.3 10*3/uL (ref 0.1–1.0)
Monocytes Relative: 7 %
Neutro Abs: 2.6 10*3/uL (ref 1.7–7.7)
Neutrophils Relative %: 60 %
PLATELETS: 202 10*3/uL (ref 150–400)
RBC: 5.1 MIL/uL (ref 3.87–5.11)
RDW: 15.1 % (ref 11.5–15.5)
WBC: 4.4 10*3/uL (ref 4.0–10.5)

## 2015-09-12 LAB — URINE MICROSCOPIC-ADD ON
Bacteria, UA: NONE SEEN
WBC, UA: NONE SEEN WBC/hpf (ref 0–5)

## 2015-09-12 MED ORDER — ONDANSETRON HCL 4 MG PO TABS: 4.0000 mg | ORAL_TABLET | Freq: Four times a day (QID) | ORAL | Status: DC | PRN

## 2015-09-12 MED ORDER — MORPHINE SULFATE (PF) 2 MG/ML IV SOLN
2.0000 mg | INTRAVENOUS | Status: DC | PRN
  Administered 2015-09-12 (×3): 2 mg via INTRAVENOUS
  Administered 2015-09-12: 4 mg via INTRAVENOUS
  Administered 2015-09-12 – 2015-09-13 (×2): 2 mg via INTRAVENOUS
  Administered 2015-09-13 – 2015-09-15 (×7): 4 mg via INTRAVENOUS
  Filled 2015-09-12 (×4): qty 2
  Filled 2015-09-12: qty 1
  Filled 2015-09-12 (×2): qty 2
  Filled 2015-09-12 (×2): qty 1
  Filled 2015-09-12: qty 2
  Filled 2015-09-12 (×2): qty 1
  Filled 2015-09-12: qty 2

## 2015-09-12 MED ORDER — DIPHENHYDRAMINE HCL 50 MG/ML IJ SOLN: 12.5000 mg | Freq: Once | INTRAMUSCULAR | Status: DC

## 2015-09-12 MED ORDER — ONDANSETRON HCL 4 MG/2ML IJ SOLN: 4.0000 mg | Freq: Four times a day (QID) | INTRAMUSCULAR | Status: DC | PRN

## 2015-09-12 MED ORDER — LORAZEPAM 2 MG/ML IJ SOLN
0.5000 mg | Freq: Once | INTRAMUSCULAR | Status: AC
Start: 2015-09-12 — End: 2015-09-12
  Administered 2015-09-12: 0.5 mg via INTRAVENOUS
  Filled 2015-09-12: qty 1

## 2015-09-12 MED ORDER — SODIUM CHLORIDE 0.9 % IV BOLUS (SEPSIS)
500.0000 mL | Freq: Once | INTRAVENOUS | Status: AC
  Administered 2015-09-12: 500 mL via INTRAVENOUS

## 2015-09-12 MED ORDER — HALOPERIDOL LACTATE 5 MG/ML IJ SOLN
2.0000 mg | Freq: Four times a day (QID) | INTRAMUSCULAR | Status: DC | PRN
Start: 2015-09-12 — End: 2015-09-15

## 2015-09-12 MED ORDER — HALOPERIDOL LACTATE 5 MG/ML IJ SOLN
2.0000 mg | Freq: Once | INTRAMUSCULAR | Status: AC
  Administered 2015-09-12: 2 mg via INTRAVENOUS
  Filled 2015-09-12: qty 1

## 2015-09-12 MED ORDER — CETYLPYRIDINIUM CHLORIDE 0.05 % MT LIQD
7.0000 mL | Freq: Two times a day (BID) | OROMUCOSAL | Status: DC
  Administered 2015-09-12 – 2015-09-15 (×5): 7 mL via OROMUCOSAL

## 2015-09-12 MED ORDER — ENSURE ENLIVE PO LIQD
237.0000 mL | Freq: Two times a day (BID) | ORAL | Status: DC
  Administered 2015-09-13 – 2015-09-15 (×5): 237 mL via ORAL

## 2015-09-12 MED ORDER — LORAZEPAM 2 MG/ML IJ SOLN
0.5000 mg | Freq: Once | INTRAMUSCULAR | Status: AC
  Administered 2015-09-12: 0.5 mg via INTRAVENOUS
  Filled 2015-09-12: qty 1

## 2015-09-12 NOTE — ED Notes (Signed)
Pt continues to have jerking arm movements. Orders received from physician.

## 2015-09-12 NOTE — H&P (Signed)
[facility] History and Physical  Angela Haltloise R Savastano ZOX:[id number] DOB: 11/04/1931    PCP:   Syliva OvermanMargaret Simpson, MD   Chief Complaint: Failure to thrive, restlessness, and unable to be cared for at home.   HPI: Angela Cordova is an 82 y.o. female with hx of advanced dementia, HTN, HLD, lost her husband this week, brought to the ER as she has been weaker, restless, and family can no longer adequately care for her.  Her PCP has spoken with family, and she is in need of hospice and comfort care.  She was evaluated in the ER, including a head CT, serology, UA, and CXR and they were not revealing.  I spoke with her son, who was at her bedside, and confirmed that family would like to have her admitted for comfort care and placement.  In the ER, she appears to have akathesia.  Her only med was Remeron which was recently increased.    ROS:  Unable.   Past Medical History  Diagnosis Date  . Hypertension   . Hyperlipidemia   . Arthritis   . Dementia   . Alzheimer's dementia   . Insomnia     Past Surgical History  Procedure Laterality Date  . Abdominal hysterectomy      Medications:  HOME MEDS: Prior to Admission medications   Medication Sig Start Date End Date Taking? Authorizing Provider  mirtazapine (REMERON) 7.5 MG tablet Take 1 tablet (7.5 mg total) by mouth at bedtime. Patient taking differently: Take 15 mg by mouth at bedtime.  08/30/15  Yes Kerri PerchesMargaret E Simpson, MD     Allergies:  Allergies  Allergen Reactions  . Aricept [Donepezil Hcl]     Agitation and chest pain   . Penicillins Other (See Comments)   Social History:   reports that she has never smoked. She has never used smokeless tobacco. She reports that she does not drink alcohol or use illicit drugs.  Family History: History reviewed. No pertinent family history.   Physical Exam: Filed Vitals:   09/12/15 1000 09/12/15 1045 09/12/15 1115 09/12/15 1215  BP: 136/76     Pulse: 68 68 78 71  Temp:      TempSrc:       Resp: 19     Height:      Weight:      SpO2: 100% 100% 100% 100%   Blood pressure 136/76, pulse 71, temperature 96.8 F (36 C), temperature source Rectal, resp. rate 19, height 5\' 6"  (1.676 m), weight 53.071 kg (117 lb), SpO2 100 %.  GEN:  She is not responding to her environment.  She has akathesia.  HEENT: Mucous membranes pink and anicteric; PERRLA; EOM intact; no cervical lymphadenopathy nor thyromegaly or carotid bruit; no JVD; There were no stridor. Neck is very supple. Breasts:: Not examined CHEST WALL: No tenderness CHEST: Normal respiration, clear to auscultation bilaterally.  HEART: Regular rate and rhythm.  There are no murmur, rub, or gallops.   BACK: No kyphosis or scoliosis; no CVA tenderness ABDOMEN: soft and non-tender; no masses, no organomegaly, normal abdominal bowel sounds; no pannus; no intertriginous candida. There is no rebound and no distention. Rectal Exam: Not done EXTREMITIES: No bone or joint deformity; age-appropriate arthropathy of the hands and knees; no edema; no ulcerations.  There is no calf tenderness. Genitalia: not examined PULSES: 2+ and symmetric SKIN: Normal hydration no rash or ulceration CNS: not responding to her environment.   Labs on Admission:  Basic Metabolic Panel:  Recent Labs  Lab 09/12/15 0955  NA 140  K 3.7  CL 107  CO2 28  GLUCOSE 86  BUN 19  CREATININE 0.88  CALCIUM 8.9   Liver Function Tests:  Recent Labs Lab 09/12/15 0955  AST 20  ALT 21  ALKPHOS 68  BILITOT 0.8  PROT 7.1  ALBUMIN 4.1   CBC:  Recent Labs Lab 09/12/15 0955  WBC 4.4  NEUTROABS 2.6  HGB 14.2  HCT 43.5  MCV 85.3  PLT 202    Radiological Exams on Admission: Ct Head Wo Contrast  09/12/2015  CLINICAL DATA:  Seizures and tremors. EXAM: CT HEAD WITHOUT CONTRAST TECHNIQUE: Contiguous axial images were obtained from the base of the skull through the vertex without intravenous contrast. COMPARISON:  10/01/2014 FINDINGS: A motion artifact  is present. Patient positioning is somewhat crooked in the scanner. Ventricles, cisterns and other CSF spaces are within normal. There is no mass, mass effect, shift of midline structures or acute hemorrhage. There is no evidence of acute infarction. Remaining bones and soft tissues are within normal. IMPRESSION: No acute intracranial findings. Electronically Signed   By: Elberta Fortis M.D.   On: 09/12/2015 11:11   Dg Chest Port 1 View  09/12/2015  CLINICAL DATA:  82 year old female with history of seizures since yesterday, progressively worsening. EXAM: PORTABLE CHEST 1 VIEW COMPARISON:  Chest x-ray 10/01/2014. FINDINGS: Lung volumes are normal. No consolidative airspace disease. No pleural effusions. No pneumothorax. No pulmonary nodule or mass noted. Pulmonary vasculature and the cardiomediastinal silhouette are within normal limits. IMPRESSION: No radiographic evidence of acute cardiopulmonary disease. Electronically Signed   By: Trudie Reed M.D.   On: 09/12/2015 10:39    Assessment/Plan Present on Admission:  . Akathisia Failure to thrive Comfort Care Only.   PLAN:  After speaking to her family, and confirmed that the GOC is comfort measure only.  Will admit her to general medical floor.    I will give oxyen, Haldol PRN, and Morphine PRN.  Will consult palliative care medicine for further recommendation.  No LABS or any diagnostic tests will be done.  Family agreed.     Other plans as per orders. Code Status:Comfort measure only.   DNR/DNI.    Houston Siren, MD. FACP [facility] Pager [phone] 7pm to 7am.  09/12/2015, 1:06 PM

## 2015-09-12 NOTE — ED Notes (Signed)
Patient assisted out of car. Per family patient having seizures. Patient states started yesterday with mild tremors and has progressively gotten worse. Family states patient has had tremors/seizures before but nothing compared to this episode. Family denies patient taking any medication for seizure. Patient has Alzheimer Angela Cordova/dementia, bed ridden and relays on family for ADLs. Per family patient not sleeping well lately and given Mirtazapine by Dr Lodema HongSimpson. Husband buried yesterday but per family patient not cognitively aware.

## 2015-09-12 NOTE — ED Provider Notes (Addendum)
CSN: [id number]     Arrival date & time 09/12/15  0932 History  By signing my name below, I, Placido Sou, attest that this documentation has been prepared under the direction and in the presence of Donnetta Hutching, MD. Electronically Signed: Placido Sou, ED Scribe. 09/12/2015. 9:54 AM.   Chief Complaint  Patient presents with  . Seizures   The history is provided by a relative. The history is limited by the condition of the patient. No language interpreter was used.   LEVEL V CAVEAT: DEMENTIA  HPI Comments: Angela Cordova is a 82 y.o. female with a PMHx of HTN, HLD and dementia who presents to the Emergency Department due to a seizure which occurred this morning. Her family states that she has dementia and is bedridden, requiring her families aid for ADL. Her family states that she experiences mild tremors which they describe as "brief jerking episodes" which worsened to a tonic, clonic movement of her extremities this morning. Per nursing note, pt recently began Mirtazapine due to difficulty sleeping which was rx by her PCP.  This dose was doubled recently. They deny she has experienced any recent illnesses. Her family denies any other complaints at this time.    Past Medical History  Diagnosis Date  . Hypertension   . Hyperlipidemia   . Arthritis   . Dementia   . Alzheimer's dementia   . Insomnia    Past Surgical History  Procedure Laterality Date  . Abdominal hysterectomy     History reviewed. No pertinent family history. Social History  Substance Use Topics  . Smoking status: Never Smoker   . Smokeless tobacco: Never Used  . Alcohol Use: No   OB History    Gravida Para Term Preterm AB TAB SAB Ectopic Multiple Living   Review of Systems  Unable to perform ROS: Dementia  Neurological: Positive for tremors and seizures.    Allergies  Aricept and Penicillins  Home Medications   Prior to Admission medications   Medication Sig Start Date End Date  Taking? Authorizing Provider  mirtazapine (REMERON) 7.5 MG tablet Take 1 tablet (7.5 mg total) by mouth at bedtime. Patient taking differently: Take 15 mg by mouth at bedtime.  08/30/15  Yes Kerri Perches, MD   BP 136/76 mmHg  Pulse 78  Temp(Src) 96.8 F (36 C) (Rectal)  Resp 19  Ht  (1.676 m)  Wt 117 lb (53.071 kg)  BMI 18.89 kg/m2  SpO2 100%    Physical Exam  Constitutional: She is oriented to person, place, and time. She appears well-developed and well-nourished.  HENT:  Head: Normocephalic and atraumatic.  Eyes: Conjunctivae and EOM are normal. Pupils are equal, round, and reactive to light.  Neck: Normal range of motion. Neck supple.  Cardiovascular: Normal rate and regular rhythm.   Pulmonary/Chest: Effort normal and breath sounds normal.  Abdominal: Soft. Bowel sounds are normal.  Musculoskeletal: Normal range of motion.  Neurological: She is alert and oriented to person, place, and time.  Skin: Skin is warm and dry.  Psychiatric: She has a normal mood and affect. Her behavior is normal.  Nursing note and vitals reviewed.   ED Course  Procedures  DIAGNOSTIC STUDIES: Oxygen Saturation is 100% on RA, normal by my interpretation.    COORDINATION OF CARE: 9:49 AM Discussed next steps with her family including labs, CT head and possible admission due to seizure of unknown etiology.  They verbalized understanding and are agreeable with the plan.   Labs Review Labs Reviewed  COMPREHENSIVE METABOLIC PANEL - Abnormal; Notable for the following:    GFR calc non Af Amer 60 (*)    All other components within normal limits  URINALYSIS, ROUTINE W REFLEX MICROSCOPIC (NOT AT [facility]) - Abnormal; Notable for the following:    Hgb urine dipstick TRACE (*)    All other components within normal limits  URINE MICROSCOPIC-ADD ON - Abnormal; Notable for the following:    Squamous Epithelial / LPF 0-5 (*)    All other components within normal limits  CBC WITH  DIFFERENTIAL/PLATELET    Imaging Review Ct Head Wo Contrast  09/12/2015  CLINICAL DATA:  Seizures and tremors. EXAM: CT HEAD WITHOUT CONTRAST TECHNIQUE: Contiguous axial images were obtained from the base of the skull through the vertex without intravenous contrast. COMPARISON:  10/01/2014 FINDINGS: A motion artifact is present. Patient positioning is somewhat crooked in the scanner. Ventricles, cisterns and other CSF spaces are within normal. There is no mass, mass effect, shift of midline structures or acute hemorrhage. There is no evidence of acute infarction. Remaining bones and soft tissues are within normal. IMPRESSION: No acute intracranial findings. Electronically Signed   By: Elberta Fortis M.D.   On: 09/12/2015 11:11   Dg Chest Port 1 View  09/12/2015  CLINICAL DATA:  82 year old female with history of seizures since yesterday, progressively worsening. EXAM: PORTABLE CHEST 1 VIEW COMPARISON:  Chest x-ray 10/01/2014. FINDINGS: Lung volumes are normal. No consolidative airspace disease. No pleural effusions. No pneumothorax. No pulmonary nodule or mass noted. Pulmonary vasculature and the cardiomediastinal silhouette are within normal limits. IMPRESSION: No radiographic evidence of acute cardiopulmonary disease. Electronically Signed   By: Trudie Reed M.D.   On: 09/12/2015 10:39   I have personally reviewed and evaluated these images and lab results as part of my medical decision-making.   EKG Interpretation   Date/Time:  Sunday Sep 12 2015 09:46:05 EDT Ventricular Rate:  68 PR Interval:  189 QRS Duration: 80 QT Interval:  444 QTC Calculation: 472 R Axis:   0 Text Interpretation:  Sinus rhythm Ventricular premature complex Probable  anteroseptal infarct, old Confirmed by Jesiah Grismer  MD, Magen Suriano ([id number]) on  09/12/2015 11:25:13 AM     CRITICAL CARE Performed by: Donnetta Hutching Total critical care time: 30 minutes Critical care time was exclusive of separately billable procedures and  treating other patients. Critical care was necessary to treat or prevent imminent or life-threatening deterioration. Critical care was time spent personally by me on the following activities: development of treatment plan with patient and/or surrogate as well as nursing, discussions with consultants, evaluation of patient's response to treatment, examination of patient, obtaining history from patient or surrogate, ordering and performing treatments and interventions, ordering and review of laboratory studies, ordering and review of radiographic studies, pulse oximetry and re-evaluation of patient's condition. MDM   Final diagnoses:  Dementia, with behavioral disturbance  Akinesia    Uncertain etiology of patient's seizure-like activity. Could be related to the Mirtazapine Rx.  Screening labs, chest x-ray, CT head, urinalysis showed no acute process. Will admit to general medicine. Discussed test results and clinical scenario with multiple family members.    I personally performed the services described in this documentation, which was scribed in my presence. The recorded information has been reviewed and is accurate.     Donnetta Hutching, MD 09/12/15 1214  Donnetta Hutching, MD 09/12/15 6045  Donnetta Hutching, MD 09/12/15 [phone]  Donnetta HutchingBrian Brylin Stanislawski, MD 09/15/15 1158

## 2015-09-12 NOTE — ED Notes (Signed)
Unable to get blood from IV site.

## 2015-09-12 NOTE — ED Notes (Signed)
Pt agitated, swinging arms, shaking,   Family at bedside and requested something be given to help her rest.  Notified Dr. Loetta RoughLei and was told to give morphine.

## 2015-09-12 NOTE — ED Notes (Signed)
Family at bedside.   Pt restless.  Moving arms and pulling covers down.

## 2015-09-12 NOTE — ED Notes (Signed)
Lab at bedside

## 2015-09-13 ENCOUNTER — Encounter (HOSPITAL_COMMUNITY): Payer: Self-pay | Admitting: Primary Care

## 2015-09-13 DIAGNOSIS — L8990 Pressure ulcer of unspecified site, unspecified stage: Secondary | ICD-10-CM

## 2015-09-13 NOTE — Progress Notes (Signed)
Present with patient's son Angela Cordova and daughter-in-law for support.

## 2015-09-13 NOTE — Progress Notes (Addendum)
Nutrition Brief Note  Chart reviewed. Talked with pt son today about her favorite foods. She took a few bites of mashed potatoes with gravy at lunch and drank 100% of her Ensure Enlive this morning. We are going to puree her meats for her comfort and add vanilla pudding to her trays.   Pt now transitioning to comfort care. Palliative team is going to discuss GOC.  No further nutrition interventions warranted at this time. Nutrition staff will work with family to taylor her soft meals to her current taste. recommend continue the Ensure Supplements since she is taking 100%.   Royann ShiversLynn Alphons Burgert MS,RD,CSG,LDN Office: [phone] Pager: [phone]

## 2015-09-13 NOTE — Care Management Important Message (Signed)
Important Message  Patient Details  Name: Angela Cordova MRN: [id number] Date of Birth: 06/12/1931   Medicare Important Message Given:  Yes    Adonis HugueninBerkhead, Shardea Cwynar L, RN 09/13/2015, 9:16 AM

## 2015-09-13 NOTE — Plan of Care (Signed)
Angela Cordova is resting quietly in bed, no family at bedside, family friend at bedside. She does not open her eyes to command or touch. Will reach out to family.

## 2015-09-13 NOTE — Progress Notes (Signed)
[facility] PROGRESS NOTE  Angela Cordova WUJ:[id number] DOB: [birth year]    PCP:   Syliva Overman, MD   HPI:  Patient has advanced dementia, HLD, HTN, admitted for inpatient comfort and hospice care.  Await further recommendation from Palliative Care consult.  Rewiew of Systems:  Unable.   Past Medical History  Diagnosis Date  . Hypertension   . Hyperlipidemia   . Arthritis   . Dementia   . Alzheimer's dementia   . Insomnia     Past Surgical History  Procedure Laterality Date  . Abdominal hysterectomy      Medications:  HOME MEDS: Prior to Admission medications   Medication Sig Start Date End Date Taking? Authorizing Provider  mirtazapine (REMERON) 7.5 MG tablet Take 1 tablet (7.5 mg total) by mouth at bedtime. Patient taking differently: Take 15 mg by mouth at bedtime.  08/30/15  Yes Kerri Perches, MD     Allergies:  Allergies  Allergen Reactions  . Aricept [Donepezil Hcl]     Agitation and chest pain   . Penicillins Other (See Comments)   Social History:   reports that she has never smoked. She has never used smokeless tobacco. She reports that she does not drink alcohol or use illicit drugs.  Family History: History reviewed. No pertinent family history.   Physical Exam: Filed Vitals:   09/12/15 1115 09/12/15 1215 09/12/15 1300 09/12/15 1631  BP:   145/89   Pulse: 78 71 75   Temp:   99 F (37.2 C) 98.7 F (37.1 C)  TempSrc:   Axillary Axillary  Resp:   20   Height:      Weight:      SpO2: 100% 100% 100%    Blood pressure 145/89, pulse 75, temperature 98.7 F (37.1 C), temperature source Axillary, resp. rate 20, height  (1.676 m), weight 53.071 kg (117 lb), SpO2 100 %.  GEN:  Pleasant  patient lying in the stretcher in no acute distress; not responsive.  HEENT: Mucous membranes pink and anicteric; PERRLA; EOM intact; no cervical lymphadenopathy nor thyromegaly or carotid bruit; no JVD; There were no stridor. Neck is very  supple. Breasts:: Not examined CHEST WALL: No tenderness CHEST: Normal respiration, clear to auscultation bilaterally.  HEART: Regular rate and rhythm.  There are no murmur, rub, or gallops.   BACK: No kyphosis or scoliosis; no CVA tenderness ABDOMEN: soft and non-tender; no masses, no organomegaly, normal abdominal bowel sounds; no pannus; no intertriginous candida. There is no rebound and no distention. Rectal Exam: Not done EXTREMITIES: No bone or joint deformity; age-appropriate arthropathy of the hands and knees; no edema; no ulcerations.  There is no calf tenderness. Genitalia: not examined PULSES: 2+ and symmetric SKIN: Normal hydration no rash or ulceration CNS: she is not responsive.    Labs on Admission:  Basic Metabolic Panel:  Recent Labs Lab 09/12/15 0955  NA 140  K 3.7  CL 107  CO2 28  GLUCOSE 86  BUN 19  CREATININE 0.88  CALCIUM 8.9   Liver Function Tests:  Recent Labs Lab 09/12/15 0955  AST 20  ALT 21  ALKPHOS 68  BILITOT 0.8  PROT 7.1  ALBUMIN 4.1   CBC:  Recent Labs Lab 09/12/15 0955  WBC 4.4  NEUTROABS 2.6  HGB 14.2  HCT 43.5  MCV 85.3  PLT 202   Radiological Exams on Admission: Ct Head Wo Contrast  09/12/2015  CLINICAL DATA:  Seizures and tremors. EXAM: CT HEAD WITHOUT CONTRAST TECHNIQUE:  Contiguous axial images were obtained from the base of the skull through the vertex without intravenous contrast. COMPARISON:  10/01/2014 FINDINGS: A motion artifact is present. Patient positioning is somewhat crooked in the scanner. Ventricles, cisterns and other CSF spaces are within normal. There is no mass, mass effect, shift of midline structures or acute hemorrhage. There is no evidence of acute infarction. Remaining bones and soft tissues are within normal. IMPRESSION: No acute intracranial findings. Electronically Signed   By: Elberta Fortisaniel  Boyle M.D.   On: 09/12/2015 11:11   Dg Chest Port 1 View  09/12/2015  CLINICAL DATA:  82 year old female with  history of seizures since yesterday, progressively worsening. EXAM: PORTABLE CHEST 1 VIEW COMPARISON:  Chest x-ray 10/01/2014. FINDINGS: Lung volumes are normal. No consolidative airspace disease. No pleural effusions. No pneumothorax. No pulmonary nodule or mass noted. Pulmonary vasculature and the cardiomediastinal silhouette are within normal limits. IMPRESSION: No radiographic evidence of acute cardiopulmonary disease. Electronically Signed   By: Trudie Reedaniel  Entrikin M.D.   On: 09/12/2015 10:39    PLAN: After speaking to her family, and confirmed that the GOC is comfort measure only. Will continue with IV Haldol PRN, and Morphine PRN.  Awaiting palliative care medicine for further recommendation. No LABS or any diagnostic tests will be done. Family agreed.    Other plans as per orders. Code Status: CMO  (comfort measure only.)   Aziz Slape, MD.  FACP [facility] Pager [phone] 7pm to 7am.  09/13/2015, 2:07 PM

## 2015-09-13 NOTE — Care Management Note (Signed)
Case Management Note  Patient Details  Name: Angela Cordova MRN: [id number] Date of Birth: 06/07/1931  Subjective/Objective: Patient laying in bed and nonverbal  , palliative care consult in place . Awaiting palliative notes.                     Action/Plan: Anticipating Hospice.    Expected Discharge Date:                  Expected Discharge Plan:  Hospice Medical Facility  In-House Referral:  Hospice / Palliative Care  Discharge planning Services  CM Consult  Post Acute Care Choice:    Choice offered to:     DME Arranged:    DME Agency:     HH Arranged:    HH Agency:     Status of Service:  In process, will continue to follow  Medicare Important Message Given:  Yes Date Medicare IM Given:    Medicare IM give by:    Date Additional Medicare IM Given:    Additional Medicare Important Message give by:     If discussed at Long Length of Stay Meetings, dates discussed:    Additional Comments:  Adonis HugueninBerkhead, Glynn Freas L, RN 09/13/2015, 3:24 PM

## 2015-09-14 MED ORDER — LORAZEPAM 1 MG PO TABS
1.0000 mg | ORAL_TABLET | Freq: Four times a day (QID) | ORAL | Status: DC | PRN
  Administered 2015-09-14: 1 mg via SUBLINGUAL
  Filled 2015-09-14: qty 1

## 2015-09-14 MED ORDER — LORAZEPAM 1 MG PO TABS
2.0000 mg | ORAL_TABLET | Freq: Four times a day (QID) | ORAL | Status: DC | PRN
  Administered 2015-09-14: 2 mg via SUBLINGUAL
  Filled 2015-09-14: qty 2

## 2015-09-14 NOTE — Consult Note (Signed)
Consultation Note Date: 09/14/2015   Patient Name: Angela Cordova  DOB: 10-23-1931  MRN: [id number]  Age / Sex: 82 y.o., female  PCP: Kerri Perches, MD Referring Physician: Houston Siren, MD  Reason for Consultation: Disposition, Establishing goals of care, Inpatient hospice referral and Psychosocial/spiritual support  HPI/Patient Profile: 82 y.o. female  with past medical history of Hypertension, hyperlipidemia, end-stage dementia with associated declines admitted on 09/12/2015 with akathisia.   Clinical Assessment and Goals of Care: Angela Cordova is lying quietly in bed, she does not open her eyes to command or touch. She is unable to lift her head from the pillow. She is also nonverbal.  Family is at bedside, son Angela Cordova and daughter-in-law Angela Cordova. Angela Cordova is known to have jerking and spastic motion at times, the family is not interested in seeking the cause, or have any invasive testing, labs or any other diagnostic exams (including telemetry monitoring). We go to my office for conference.   Son, Angela Cordova, shares [name] recent declines. She saw her primary care physician, Dr. Lodema Hong, in October 2016 with notes that Angela Cordova was anxious to leave, deterioration over the last year, medications discontinued by family, and a 25 pound weight loss in 5 months.  Her normal, healthy weight was 178 pounds. Angela Cordova shares that (his wife, Angela Cordova) father married Angela Cordova (20+ years).  They share that he recently died and this has also caused a deterioration in Angela Cordova.  She is now bedbound, unable to transfer, unable to participate in any ADLs, is incontinent of both bowel and bladder, and is fed by others.  Angela Cordova says that Angela Cordova had a bed sore in January 2017 but they were able to heal this through keeping her clean and dry, using Desiden, and frequent turns.  Angela Cordova shares that the last  coherent statement Angela Cordova made was, "I'm ready to go", meaning that she was ready to die.    Angela Cordova talks about his experience with his grandmother's death, Angela Cordova mother, and how his mother is declining in the same manner. He shares that his goal is for his mother to be kept comfortable, let nature take its course. We talk about hospice home in [place], and Angela Cordova shares that he is familiar with [facility] and his goal is for his mother to spend her final days there. I share that there are guidelines and criteria for Angela Cordova to be admitted but that I will reach out for a referral.  She has been changed to a pure diet by dietitian.  NEXT OF KIN, Angela Cordova is unable to make her own health care decisions at this time, her only child, son Angela Cordova is her decision maker.    SUMMARY OF RECOMMENDATIONS    Family is requesting placement in hospice facility for full comfort care. no diagnostics or testing, no lab work.    Code Status/Advance Care Planning:  DNR, allowing natural death, no IV fluids, no antibiotics, do not rehospitalize.  Symptom Management:  per hospitalist, adding sublingual Ativan.  Palliative Prophylaxis:   Aspiration, Bowel Regimen, Oral Care and Turn Reposition  Additional Recommendations (Limitations, Scope, Preferences):  Full Comfort Care  Psycho-social/Spiritual:   Desire for further Chaplaincy support:no  Additional Recommendations: Caregiving  Support/Resources and Grief/Bereavement Support  Prognosis:   < 4 weeks, based on decline related to end stage dementia, and families desire to focus on comfort measures only.  Discharge Planning: Family goal is hospice home of [facility], referral made.      Primary Diagnoses: Present on Admission:  . Akathisia  I have reviewed the medical record, interviewed the patient and family, and examined the patient. The following aspects are pertinent.  Past Medical  History  Diagnosis Date  . Hypertension   . Hyperlipidemia   . Arthritis   . Dementia   . Alzheimer's dementia   . Insomnia    Social History   Social History  . Marital Status: Married    Spouse Name: N/A  . Number of Children: N/A  . Years of Education: N/A   Social History Main Topics  . Smoking status: Never Smoker   . Smokeless tobacco: Never Used  . Alcohol Use: No  . Drug Use: No  . Sexual Activity: Not Currently   Other Topics Concern  . None   Social History Narrative   History reviewed. No pertinent family history. Scheduled Meds: . antiseptic oral rinse  7 mL Mouth Rinse BID  . feeding supplement (ENSURE ENLIVE)  237 mL Oral BID BM   Continuous Infusions:  PRN Meds:.haloperidol lactate, morphine injection, ondansetron **OR** ondansetron (ZOFRAN) IV Medications Prior to Admission:  Prior to Admission medications   Medication Sig Start Date End Date Taking? Authorizing Provider  mirtazapine (REMERON) 7.5 MG tablet Take 1 tablet (7.5 mg total) by mouth at bedtime. Patient taking differently: Take 15 mg by mouth at bedtime.  08/30/15  Yes Kerri Perches, MD   Allergies  Allergen Reactions  . Aricept [Donepezil Hcl]     Agitation and chest pain    Review of Systems  Unable to perform ROS: Dementia    Physical Exam  Constitutional: No distress.  Frail and thin, temporal wasting  HENT:  Head: Normocephalic and atraumatic.  Cardiovascular: Normal rate and regular rhythm.   No edema  Pulmonary/Chest: Effort normal. No respiratory distress.  Abdominal: Soft. She exhibits no distension.  Neurological:  Does not make eye contact, eyes flutter but do not open fully, nonverbal  Skin: Skin is warm and dry.  History of decubitus January 2017, healed by family with skin care, turning.  Nursing note and vitals reviewed.   Vital Signs: BP 118/92 mmHg  Pulse 92  Temp(Src) 97.4 F (36.3 C) (Oral)  Resp 20  Ht  (1.676 m)  Wt 53.071 kg (117 lb)   BMI 18.89 kg/m2  SpO2 99% Pain Assessment: Faces   Pain Score: Asleep   SpO2: SpO2: 99 % O2 Device:SpO2: 99 % O2 Flow Rate: .   IO: Intake/output summary:  Intake/Output Summary (Last 24 hours) at 09/14/15 1133 Last data filed at 09/14/15 1005  Gross per 24 hour  Intake    320 ml  Output      0 ml  Net    320 ml    LBM:   Baseline Weight: Weight: 53.071 kg (117 lb) Most recent weight: Weight: 53.071 kg (117 lb)     Palliative Assessment/Data:   Flowsheet Rows        Most Recent  Value   Intake Tab    Referral Department  Hospitalist   Unit at Time of Referral  Med/Surg Unit   Palliative Care Primary Diagnosis  Neurology   Date Notified  09/12/15   Palliative Care Type  New Palliative care   Reason for referral  Clarify Goals of Care, End of Life Care Assistance   Date of Admission  09/12/15   Date first seen by Palliative Care  09/13/15   # of days Palliative referral response time  1 Day(s)   # of days IP prior to Palliative referral  0   Clinical Assessment    Palliative Performance Scale Score  20%   Pain Max last 24 hours  Not able to report   Pain Min Last 24 hours  Not able to report   Dyspnea Max Last 24 Hours  Not able to report   Dyspnea Min Last 24 hours  Not able to report   Psychosocial & Spiritual Assessment    Palliative Care Outcomes    Patient/Family meeting held?  No   Palliative Care follow-up planned  -- [Follow-up at [facility]]      Time In: 1030 Time Out: 1150 Time Total: 80 minutes Greater than 50%  of this time was spent counseling and coordinating care related to the above assessment and plan.  Signed by: Katheran Aweove,Tasha A, NP   Please contact Palliative Medicine Team phone at [phone] for questions and concerns.  For individual provider: See Loretha StaplerAmion

## 2015-09-14 NOTE — Progress Notes (Signed)
[facility] PROGRESS NOTE  CHERYLIN WAGUESPACK YNW:[id number] DOB: March 20, 1932    PCP:   Syliva Overman, MD   HPI: Patient has advanced dementia, HLD, HTN, admitted for inpatient comfort and hospice care. She was not accept into inpatient hospice, so awaiting further disposition.     Past Medical History  Diagnosis Date  . Hypertension   . Hyperlipidemia   . Arthritis   . Dementia   . Alzheimer's dementia   . Insomnia     Past Surgical History  Procedure Laterality Date  . Abdominal hysterectomy      Medications:  HOME MEDS: Prior to Admission medications   Medication Sig Start Date End Date Taking? Authorizing Provider  mirtazapine (REMERON) 7.5 MG tablet Take 1 tablet (7.5 mg total) by mouth at bedtime. Patient taking differently: Take 15 mg by mouth at bedtime.  08/30/15  Yes Kerri Perches, MD     Allergies:  Allergies  Allergen Reactions  . Aricept [Donepezil Hcl]     Agitation and chest pain   . Penicillins Other (See Comments)   Social History:   reports that she has never smoked. She has never used smokeless tobacco. She reports that she does not drink alcohol or use illicit drugs.  Family History: History reviewed. No pertinent family history.   Physical Exam: Filed Vitals:   09/13/15 1519 09/13/15 2144 09/14/15 0532 09/14/15 1435  BP: 122/65 130/63 118/92 114/72  Pulse: 90 93 92 88  Temp: 98.6 F (37 C) 99 F (37.2 C) 97.4 F (36.3 C) 97.4 F (36.3 C)  TempSrc:  Oral Oral   Resp: Height:      Weight:      SpO2: 99% 98% 99% 98%   Blood pressure 114/72, pulse 88, temperature 97.4 F (36.3 C), temperature source Oral, resp. rate 20, height  (1.676 m), weight 53.071 kg (117 lb), SpO2 98 %.  GEN:  Pleasant  patient lying in the stretcher in no acute distress; HEENT: Mucous membranes pink and anicteric; PERRLA; EOM intact; no cervical lymphadenopathy nor thyromegaly or carotid bruit; no JVD; There were no stridor. Neck is  very supple. Breasts:: Not examined CHEST WALL: No tenderness CHEST: Normal respiration, clear to auscultation bilaterally.  HEART: Regular rate and rhythm.  There are no murmur, rub, or gallops.   BACK: No kyphosis or scoliosis; no CVA tenderness ABDOMEN: soft and non-tender; no masses, no organomegaly, normal abdominal bowel sounds; no pannus; no intertriginous candida. There is no rebound and no distention. Rectal Exam: Not done EXTREMITIES: No bone or joint deformity; age-appropriate arthropathy of the hands and knees; no edema; no ulcerations.  There is no calf tenderness. Genitalia: not examined PULSES: 2+ and symmetric SKIN: Normal hydration no rash or ulceration CNS: Not responding to environment.    Labs on Admission:  Basic Metabolic Panel:  Recent Labs Lab 09/12/15 0955  NA 140  K 3.7  CL 107  CO2 28  GLUCOSE 86  BUN 19  CREATININE 0.88  CALCIUM 8.9   Liver Function Tests:  Recent Labs Lab 09/12/15 0955  AST 20  ALT 21  ALKPHOS 68  BILITOT 0.8  PROT 7.1  ALBUMIN 4.1   CBC:  Recent Labs Lab 09/12/15 0955  WBC 4.4  NEUTROABS 2.6  HGB 14.2  HCT 43.5  MCV 85.3  PLT 202      Assessment/Plan Present on Admission:  . Akathisia  PLAN:  After speaking to her family, and confirmed that the  GOC is comfort measure only. Will continue with IV Haldol PRN, and Morphine PRN.Awaiting disposition.   No LABS or any diagnostic tests will be done. Family agreed.   Other plans as per orders.  Code Status: Comfort Measure Only.    Houston SirenLE,Donnisha Besecker, MD.  FACP [facility] Pager [phone] 7pm to 7am.  09/14/2015, 5:59 PM

## 2015-09-14 NOTE — Care Management Note (Addendum)
Case Management Note  Patient Details  Name: Loura Haltloise R Tessler MRN: [id number] Date of Birth: 06/23/1931  Subjective/Objective: Spoke with patients adult children at the bedside with Lillia Carmelasha Dove from palliative care.  [facility].  Palliative feels patient has less that 6 weeks to live.  Son, Park LiterJames Richardson, stated that they need time to take this in,  Family may be willing to consider other options such as Home with hospice or nursing home with self pay and hospice.  Family is open to other home hospice agencies. [facility]. [place] [facility] considering accepting patient for home with hospice care but no determination yet.  More to follow.                      Action/Plan:Hospice care.   Expected Discharge Date:                  Expected Discharge Plan:  Hospice Medical Facility  In-House Referral:  Hospice / Palliative Care  Discharge planning Services  CM Consult  Post Acute Care Choice:    Choice offered to:     DME Arranged:    DME Agency:     HH Arranged:    HH Agency:     Status of Service:  In process, will continue to follow  Medicare Important Message Given:  Yes Date Medicare IM Given:    Medicare IM give by:    Date Additional Medicare IM Given:    Additional Medicare Important Message give by:     If discussed at Long Length of Stay Meetings, dates discussed:    Additional Comments:  Adonis HugueninBerkhead, Kanita Delage L, RN 09/14/2015, 3:33 PM

## 2015-09-14 NOTE — Care Management (Signed)
[facility] as per family permission.  [facility] would be willing to take Home with Hospice if family decides on this. Annice PihJackie will be covering for Leonette MostCharles this week [phone]. Informed Palliative NP Lillia Carmelasha Dove and NCM Kathyrn SheriffJessica Childress of this.  No decission from family yet. [facility] per [facility] Justice.

## 2015-09-15 DIAGNOSIS — F0391 Unspecified dementia with behavioral disturbance: Secondary | ICD-10-CM

## 2015-09-15 DIAGNOSIS — Z7189 Other specified counseling: Secondary | ICD-10-CM

## 2015-09-15 DIAGNOSIS — F0390 Unspecified dementia without behavioral disturbance: Secondary | ICD-10-CM | POA: Insufficient documentation

## 2015-09-15 DIAGNOSIS — Z515 Encounter for palliative care: Secondary | ICD-10-CM

## 2015-09-15 MED ORDER — LORAZEPAM 2 MG/ML IJ SOLN
1.0000 mg | INTRAMUSCULAR | Status: DC | PRN
Start: 2015-09-15 — End: 2015-09-15
  Administered 2015-09-15 (×2): 1 mg via INTRAVENOUS
  Filled 2015-09-15 (×2): qty 1

## 2015-09-15 MED ORDER — LORAZEPAM 2 MG/ML PO CONC
2.0000 mg | ORAL | Status: AC | PRN
Start: 2015-09-15 — End: ?

## 2015-09-15 MED ORDER — LORAZEPAM 2 MG/ML PO CONC: 2.0000 mg | ORAL | Status: DC | PRN

## 2015-09-15 MED ORDER — MORPHINE SULFATE (CONCENTRATE) 10 MG /0.5 ML PO SOLN: 20.0000 mg | ORAL | Status: AC | PRN

## 2015-09-15 NOTE — Progress Notes (Signed)
Patient alert and oriented, independent, VSS, pt. Tolerating diet well. No complaints of pain or nausea. Pt. Had IV removed tip intact. Pt. Had prescriptions given. Pt. Voiced understanding of discharge instructions with no further questions. Pt. Discharged to EMS for transport to residence.

## 2015-09-15 NOTE — Care Management Note (Signed)
Case Management Note  Patient Details  Name: Angela Cordova MRN: [id number] Date of Birth: 10/27/1931  Expected Discharge Date:    09/15/2015              Expected Discharge Plan:  Home w Hospice Care  In-House Referral:  Hospice / Palliative Care  Discharge planning Services  CM Consult  Post Acute Care Choice:  Home Health Choice offered to:  Patient  DME Arranged:  Hospital bed DME Agency:  [facility]  HH Arranged:  RN HH Agency:  [facility]  Status of Service:  Completed, signed off  Medicare Important Message Given:  Yes Date Medicare IM Given:    Medicare IM give by:    Date Additional Medicare IM Given:    Additional Medicare Important Message give by:     If discussed at Long Length of Stay Meetings, dates discussed:    Additional Comments: [facility] has accepted pt, family made aware and state they will need hospital bed delivered and then will be ready for pt to come home. Family anticipate DC home today. Donnella Shamharlse Alston, of [facility] is aware of DC and will order hospital bed for delivery today.    Malcolm Metrohildress, Keller Bounds Demske, RN 09/15/2015, 9:19 AM

## 2015-09-15 NOTE — Discharge Summary (Signed)
Physician Discharge Summary  Angela Cordova:[id number] DOB: 06/29/1931 DOA: 09/12/2015  PCP: Syliva Overman, MD  Admit date: 09/12/2015 Discharge date: 09/15/2015  Time spent: 35 minutes  Recommendations for Outpatient Follow-up:  1. Discharge to home hospice.  2. Symptom management per hospice protocol.   Discharge Diagnoses:  Principal Problem:   Akathisia Active Problems:   Seizure (HCC)   FTT (failure to thrive) in adult   Pressure ulcer Advanced dementia  Discharge Condition:   Diet recommendation: regular   Filed Weights   09/12/15 0941  Weight: 53.071 kg (117 lb)    History of present illness:  82 yof with historu of HLS, HTN, and advance dementia presented with family with request for comfort care and placement. In th ED she was noted to gave akathesia and her only medication was Rameron, which was recently increased. She has not been accepted into inpatient hospice.   Hospital Course:  Patient presented with family with complaints of failure to thrive in the setting of advance dementia. Since her husband's death two weeks prior she has slowly started becoming weaker and restless. She has been bedbound for two months and losing weight for six months. Since she has had progressive decline, family wishes to focus care on comfort. Family has requested hospice for end-of-life care. Plan to discharge home with hospice services once hospital bed has been set up at home.   Procedures:  None  Consultations:  PMT  Discharge Exam: Filed Vitals:   09/15/15 0500 09/15/15 1259  BP: 115/76 108/84  Pulse: 81 89  Temp: 98 F (36.7 C) 97.5 F (36.4 C)  Resp: 18 20    General: NAD. Does not participate in exam. Cardiovascular: RRR, S1, S2  Respiratory: clear bilaterally, No wheezing, rales or rhonchi    Discharge Instructions   Discharge Instructions    Diet - low sodium heart healthy    Complete by:  As directed      Increase activity slowly    Complete  by:  As directed           Current Discharge Medication List    START taking these medications   Details  LORazepam (ATIVAN) 2 MG/ML concentrated solution Take 1 mL (2 mg total) by mouth every 4 (four) hours as needed for anxiety. Qty: 30 mL, Refills: 0    Morphine Sulfate (MORPHINE CONCENTRATE) 10 mg / 0.5 ml concentrated solution Place 1 mL (20 mg total) under the tongue every 2 (two) hours as needed for severe pain or shortness of breath. Qty: 120 mL, Refills: 0      STOP taking these medications     mirtazapine (REMERON) 7.5 MG tablet        Allergies  Allergen Reactions  . Aricept [Donepezil Hcl]     Agitation and chest pain   . Penicillins Other (See Comments)     The results of significant diagnostics from this hospitalization (including imaging, microbiology, ancillary and laboratory) are listed below for reference.    Significant Diagnostic Studies: Ct Head Wo Contrast  09/12/2015  CLINICAL DATA:  Seizures and tremors. EXAM: CT HEAD WITHOUT CONTRAST TECHNIQUE: Contiguous axial images were obtained from the base of the skull through the vertex without intravenous contrast. COMPARISON:  10/01/2014 FINDINGS: A motion artifact is present. Patient positioning is somewhat crooked in the scanner. Ventricles, cisterns and other CSF spaces are within normal. There is no mass, mass effect, shift of midline structures or acute hemorrhage. There is no evidence of acute infarction.  Remaining bones and soft tissues are within normal. IMPRESSION: No acute intracranial findings. Electronically Signed   By: Elberta Fortisaniel  Boyle M.D.   On: 09/12/2015 11:11   Dg Chest Port 1 View  09/12/2015  CLINICAL DATA:  82 year old female with history of seizures since yesterday, progressively worsening. EXAM: PORTABLE CHEST 1 VIEW COMPARISON:  Chest x-ray 10/01/2014. FINDINGS: Lung volumes are normal. No consolidative airspace disease. No pleural effusions. No pneumothorax. No pulmonary nodule or mass  noted. Pulmonary vasculature and the cardiomediastinal silhouette are within normal limits. IMPRESSION: No radiographic evidence of acute cardiopulmonary disease. Electronically Signed   By: Trudie Reedaniel  Entrikin M.D.   On: 09/12/2015 10:39    Microbiology: No results found for this or any previous visit (from the past 240 hour(s)).   Labs: Basic Metabolic Panel:  Recent Labs Lab 09/12/15 0955  NA 140  K 3.7  CL 107  CO2 28  GLUCOSE 86  BUN 19  CREATININE 0.88  CALCIUM 8.9   Liver Function Tests:  Recent Labs Lab 09/12/15 0955  AST 20  ALT 21  ALKPHOS 68  BILITOT 0.8  PROT 7.1  ALBUMIN 4.1  CBC:  Recent Labs Lab 09/12/15 0955  WBC 4.4  NEUTROABS 2.6  HGB 14.2  HCT 43.5  MCV 85.3  PLT 202     Signed:  Erick BlinksMemon, Jermarion Poffenberger, MD   [facility] 09/15/2015, 1:14 PM    By signing my name below, I, Angela Cordova, attest that this documentation has been prepared under the direction and in the presence of Erick BlinksJehanzeb Nikolai Wilczak, MD. Electronically signed: Zadie CleverlyJessica Cordova, Scribe. 09/15/2015 1:01pm   I, Dr. Erick BlinksJehanzeb Chrisy Hillebrand, personally performed the services described in this documentaiton. All medical record entries made by the scribe were at my direction and in my presence. I have reviewed the chart and agree that the record reflects my personal performance and is accurate and complete  Erick BlinksJehanzeb Savior Himebaugh, MD, 09/15/2015 1:14 PM

## 2015-09-15 NOTE — Clinical Social Work Note (Signed)
CSW met with pt's son and daughter-in-law in room. Support provided as family just experienced loss of pt's husband last week as well. They understand that [place] feels pt does not meet criteria at this time. Other options discussed including nursing home vs home with hospice. At this point, they plan to take pt home with hospice services as this is what they did for pt's husband. CM notified. CSW will sign off, but can be reconsulted if needed.  Benay Pike, [place]

## 2015-09-15 NOTE — Progress Notes (Signed)
Daily Progress Note   Patient Name: Angela Cordova       Date: 09/15/2015 DOB: 01/22/1932  Age: 82 y.o. MRN#: [id number] Attending Physician: Erick BlinksJehanzeb Memon, MD Primary Care Physician: Syliva OvermanMargaret Simpson, MD Admit Date: 09/12/2015  Reason for Consultation/Follow-up: Disposition, Hospice Evaluation, Psychosocial/spiritual support and Terminal Care  Subjective: Angela Cordova family is in the waiting room, they call out to me as I pass on the way to her room. We talk about disposition, home with [facility] hospice today. Fayrene FearingJames shares that he is awaiting [facility].  We talk about symptom management, and Fayrene FearingJames feels that his mother's symptoms are well-controlled at this time. He shares that 4 mg of morphine seems to work better to keep her comfortable. We talk about managing oral secretions with either scopolamine or atropine. I encourage him to lean on hospice nurses for guidance and symptom management. I share that likely they will come 2 to 3 times per week, and are always available by phone. I encourage them, that they have provided excellent care for Angela Cordova, and that offering her comfort during this time is a International aid/development workerwonderful gift.  They share their recent experiences with her husband's passing. They are very well prepared to care for her at home with hospice.  Length of Stay: 3  Current Medications: Scheduled Meds:  . antiseptic oral rinse  7 mL Mouth Rinse BID  . feeding supplement (ENSURE ENLIVE)  237 mL Oral BID BM    Continuous Infusions:    PRN Meds: haloperidol lactate, LORazepam, morphine injection, ondansetron **OR** ondansetron (ZOFRAN) IV  Physical Exam  Constitutional: No distress.  HENT:  Head: Normocephalic and atraumatic.    Cardiovascular: Normal rate.   Pulmonary/Chest: Effort normal. No respiratory distress.  Abdominal: Soft. She exhibits no distension.  Neurological:  Lethargic, does not open eyes to command or touch  Skin: Skin is warm and dry.  Nursing note and vitals reviewed.           Vital Signs: BP 108/84 mmHg  Pulse 89  Temp(Src) 97.5 F (36.4 C) (Axillary)  Resp 20  Ht 5\' 6"  (1.676 m)  Wt 53.071 kg (117 lb)  BMI 18.89 kg/m2  SpO2 100% SpO2: SpO2: 100 % O2 Device: O2 Device: Not Delivered O2 Flow Rate:  Intake/output summary:  Intake/Output Summary (Last 24 hours) at 09/15/15 1308 Last data filed at 09/14/15 1700  Gross per 24 hour  Intake      0 ml  Output      0 ml  Net      0 ml   LBM:   Baseline Weight: Weight: 53.071 kg (117 lb) Most recent weight: Weight: 53.071 kg (117 lb)       Palliative Assessment/Data:    Flowsheet Rows        Most Recent Value   Intake Tab    Referral Department  Hospitalist   Unit at Time of Referral  Med/Surg Unit   Palliative Care Primary Diagnosis  Neurology   Date Notified  09/12/15   Palliative Care Type  New Palliative care   Reason for referral  Clarify Goals of Care, End of Life Care Assistance   Date of Admission  09/12/15   Date first seen by Palliative Care  09/13/15   # of days Palliative referral response time  1 Day(s)   # of days IP prior to Palliative referral  0   Clinical Assessment    Palliative Performance Scale Score  20%   Pain Max last 24 hours  Not able to report   Pain Min Last 24 hours  Not able to report   Dyspnea Max Last 24 Hours  Not able to report   Dyspnea Min Last 24 hours  Not able to report   Psychosocial & Spiritual Assessment    Palliative Care Outcomes    Patient/Family meeting held?  No   Palliative Care follow-up planned  -- [Follow-up at [facility]]      Patient Active Problem List   Diagnosis Date Noted  . Pressure ulcer 09/13/2015  . Seizure (HCC) 09/12/2015  . Akathisia 09/12/2015  .  FTT (failure to thrive) in adult 09/12/2015  . Medicare annual wellness visit, subsequent 02/27/2015  . Weight loss, unintentional 05/24/2014  . Stool incontinence 09/29/2012  . Alzheimer's disease 10/17/2011  . IMPAIRED FASTING GLUCOSE 09/22/2009  . Essential hypertension 08/06/2008  . Hyperlipemia 07/25/2007  . OSTEOARTHRITIS, KNEE 07/25/2007    Palliative Care Assessment & Plan   Patient Profile: Angela Cordova is an 82 year old female patient with a history of end-stage dementia, (bed-bound, nonverbal), hypertension, hyperlipidemia and arthritis. She's had of marked decline over the past 2 weeks after the passing of her husband, her main caregiver.   Assessment: as above  Recommendations/Plan:  home with [facility] hospice  Goals of Care and Additional Recommendations:  Limitations on Scope of Treatment: Full Comfort Care  Code Status:    Code Status Orders        Start     Ordered   09/12/15 1424  Do not attempt resuscitation (DNR)   Continuous    Question Answer Comment  In the event of cardiac or respiratory ARREST Do not call a "code blue"   In the event of cardiac or respiratory ARREST Do not perform Intubation, CPR, defibrillation or ACLS   In the event of cardiac or respiratory ARREST Use medication by any route, position, wound care, and other measures to relive pain and suffering. May use oxygen, suction and manual treatment of airway obstruction as needed for comfort.      09/12/15 1423    Code Status History    Date Active Date Inactive Code Status Order ID Comments User Context   09/12/2015  9:51 AM 09/12/2015  2:23 PM DNR [id number]  Donnetta Hutching,  MD ED    Advance Directive Documentation        Most Recent Value   Type of Advance Directive  Living will   Pre-existing out of facility DNR order (yellow form or pink MOST form)     "MOST" Form in Place?         Prognosis:   < 2 weeks  Discharge Planning:  Home with Hospice  Care plan was discussed  with Nursing stock staff, case manager, social worker, and Dr. Kerry Hough.   Thank you for allowing the Palliative Medicine Team to assist in the care of this patient.   Time In: 1130 Time Out: 1155 Total Time 25 minutes Prolonged Time Billed  no       Greater than 50%  of this time was spent counseling and coordinating care related to the above assessment and plan.  Clayson Riling A, NP  Please contact Palliative Medicine Team phone at [phone] for questions and concerns.

## 2015-09-15 NOTE — Care Management Important Message (Signed)
Important Message  Patient Details  Name: Angela Cordova MRN: [id number] Date of Birth: 07/16/1931   Medicare Important Message Given:  Yes    Malcolm MetroChildress, Luster Hechler Demske, RN 09/15/2015, 9:19 AM

## 2015-09-30 DEATH — deceased

## 2015-10-14 DIAGNOSIS — Z7189 Other specified counseling: Secondary | ICD-10-CM | POA: Insufficient documentation

## 2016-10-05 ENCOUNTER — Telehealth: Payer: Self-pay | Admitting: Internal Medicine

## 2016-10-05 NOTE — Telephone Encounter (Signed)
Letter mailed to pt.  

## 2016-10-05 NOTE — Telephone Encounter (Signed)
RECALL FOR TCS °

## 2017-02-23 IMAGING — CT CT HEAD W/O CM
1 of 2 series · 13 of 30 positions shown, 17 images · non-contrast
Comparison: 10/01/2014

CLINICAL DATA: Seizures and tremors.

EXAM:
CT HEAD WITHOUT CONTRAST
TECHNIQUE: Contiguous axial images were obtained from the base of the skull
through the vertex without intravenous contrast.

[Series 2: headseq 4.8 h37s · axial · 0.46mm/px · z∈[+87,+237]mm · 13 of 36 slices shown, 17 images]
[im 3/36  brain]
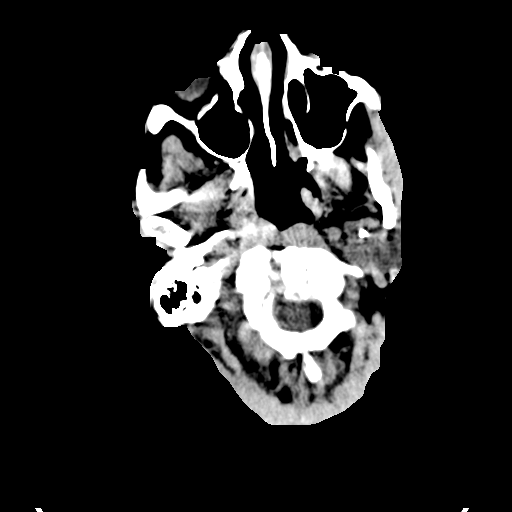
[im 3/36  bone]
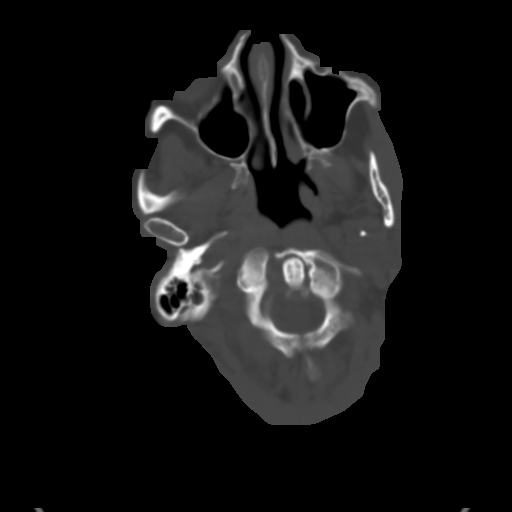
[im 6/36  brain]
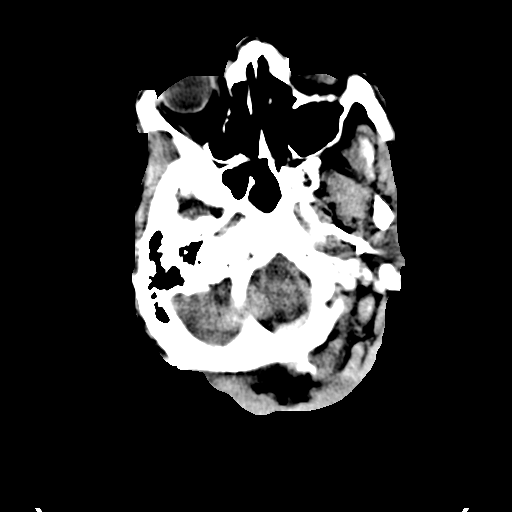
[im 8/36  brain]
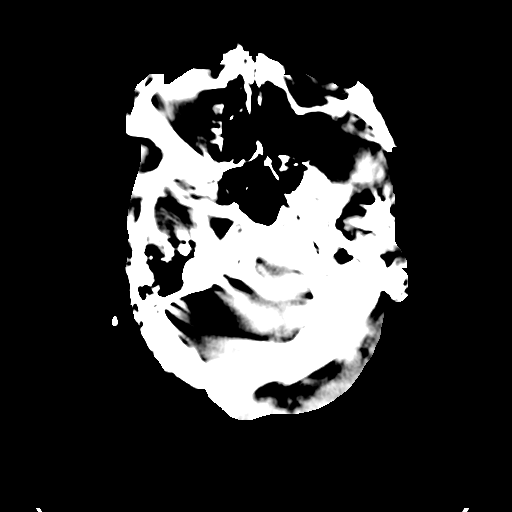
[im 11/36  brain]
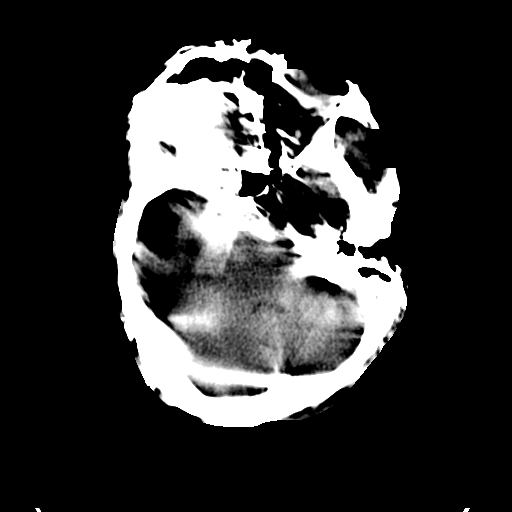
[im 13/36  brain]
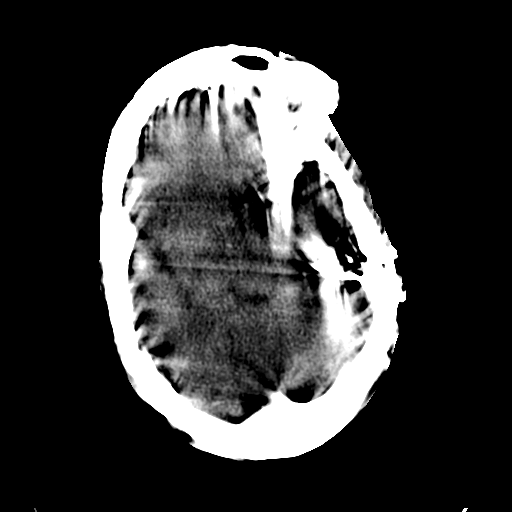
[im 13/36  bone]
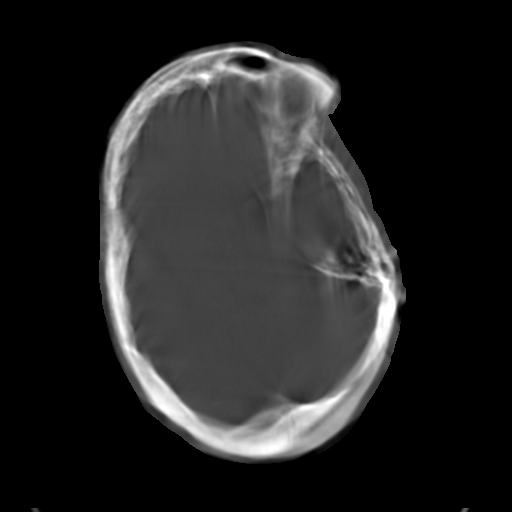
[im 16/36  brain]
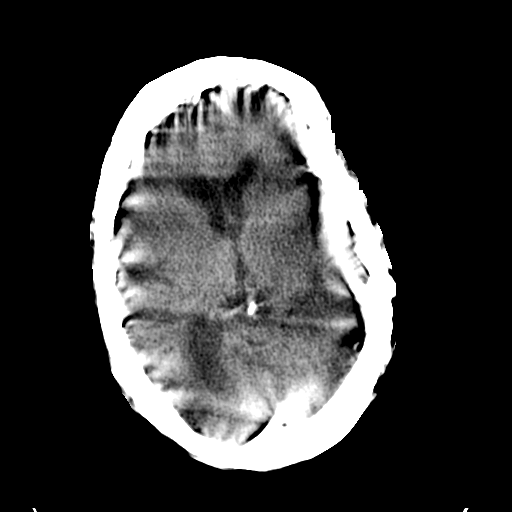
[im 18/36  brain]
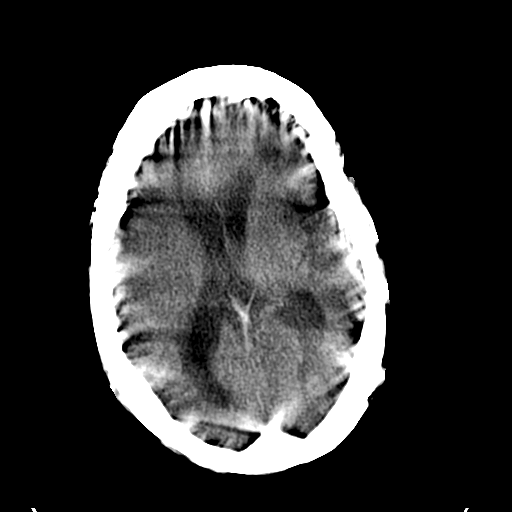
[im 21/36  brain]
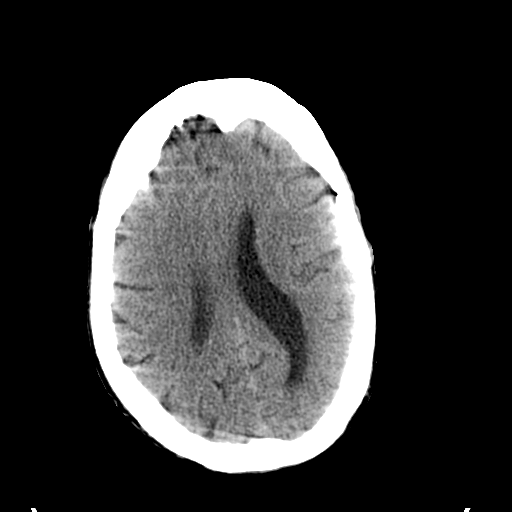
[im 23/36  brain]
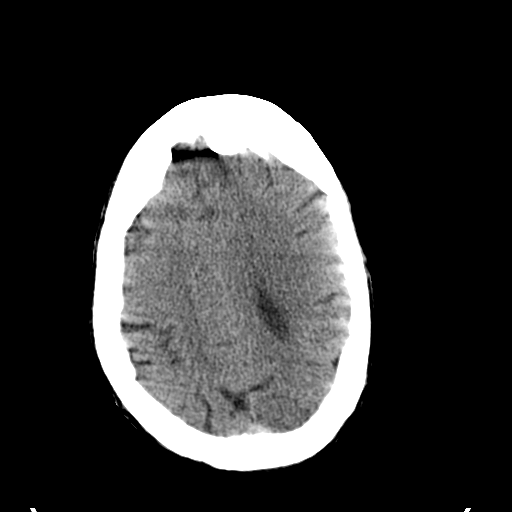
[im 23/36  bone]
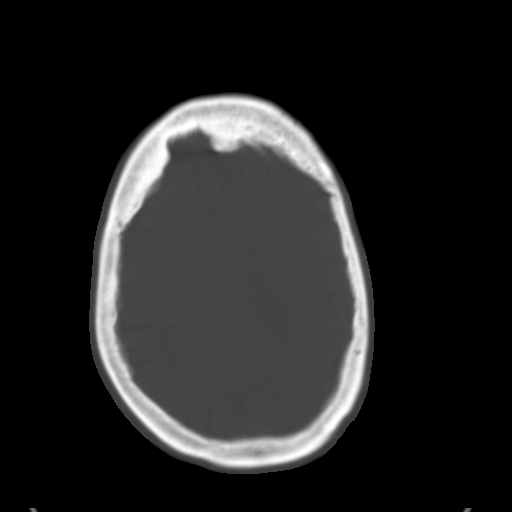
[im 26/36  brain]
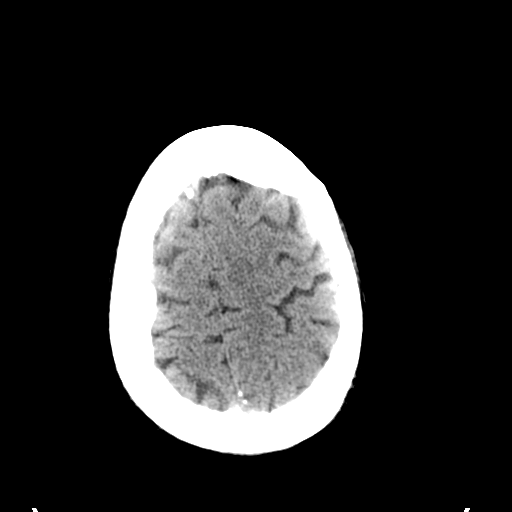
[im 28/36  brain]
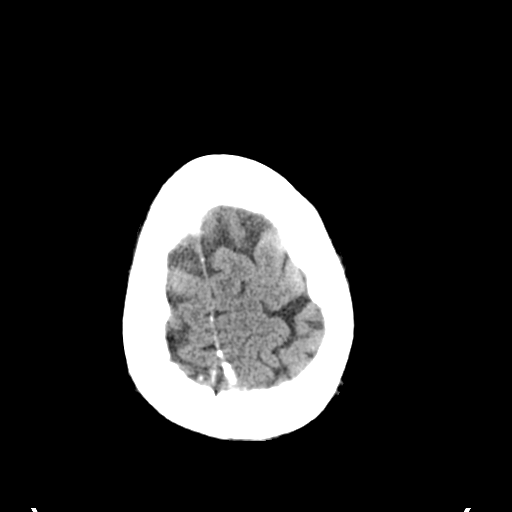
[im 31/36  brain]
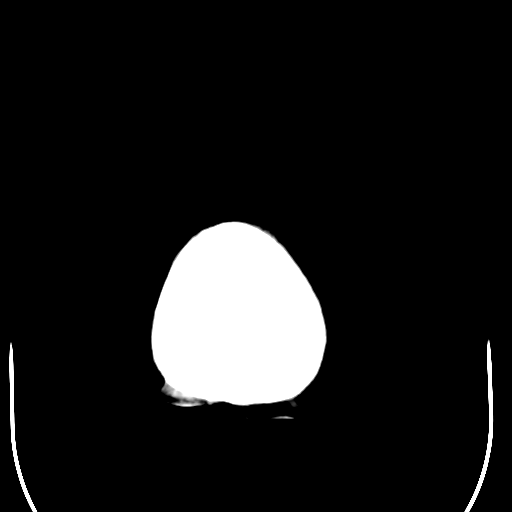
[im 33/36  brain]
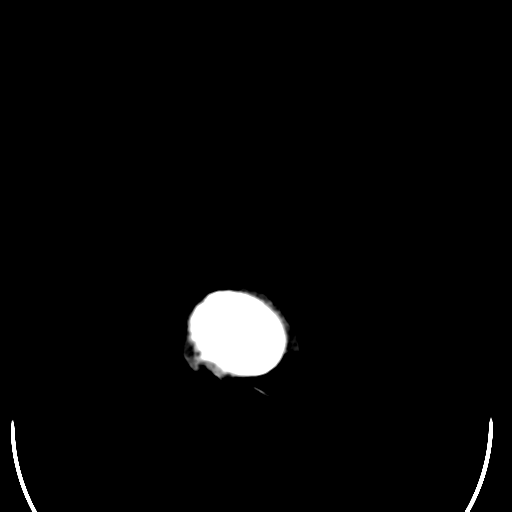
[im 33/36  bone]
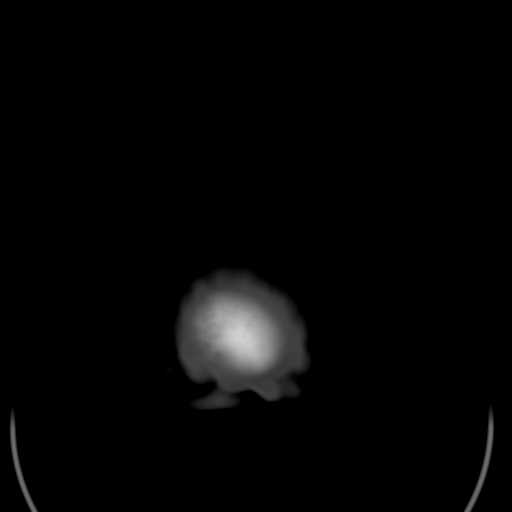

[13 of 30 positions shown; findings below may reference images not displayed]

FINDINGS: A motion artifact is present. Patient positioning is somewhat
crooked in the scanner. Ventricles, cisterns and other CSF spaces
are within normal. There is no mass, mass effect, shift of midline
structures or acute hemorrhage. There is no evidence of acute
infarction. Remaining bones and soft tissues are within normal.
IMPRESSION: No acute intracranial findings.
